# Patient Record
Sex: Female | Born: 1945
Health system: Southern US, Community
[De-identification: ages and names within clinical notes are randomized; demographics above are authoritative.]

## PROBLEM LIST (undated history)

## (undated) DIAGNOSIS — E039 Hypothyroidism, unspecified: Secondary | ICD-10-CM

## (undated) DIAGNOSIS — N390 Urinary tract infection, site not specified: Secondary | ICD-10-CM

## (undated) DIAGNOSIS — K219 Gastro-esophageal reflux disease without esophagitis: Secondary | ICD-10-CM

## (undated) DIAGNOSIS — Z973 Presence of spectacles and contact lenses: Secondary | ICD-10-CM

## (undated) DIAGNOSIS — E7800 Pure hypercholesterolemia, unspecified: Secondary | ICD-10-CM

## (undated) DIAGNOSIS — Z972 Presence of dental prosthetic device (complete) (partial): Secondary | ICD-10-CM

## (undated) HISTORY — PX: TUBAL LIGATION: SHX77

## (undated) HISTORY — DX: Pure hypercholesterolemia, unspecified: E78.00

## (undated) HISTORY — PX: OTHER SURGICAL HISTORY: SHX169

## (undated) HISTORY — PX: COLONOSCOPY: SHX174

## (undated) HISTORY — PX: PILONIDAL CYST EXCISION: SHX744

---

## 2000-11-24 ENCOUNTER — Other Ambulatory Visit: Admission: RE | Admit: 2000-11-24 | Discharge: 2000-11-24 | Payer: Self-pay | Admitting: *Deleted

## 2002-02-10 ENCOUNTER — Other Ambulatory Visit: Admission: RE | Admit: 2002-02-10 | Discharge: 2002-02-10 | Payer: Self-pay | Admitting: Obstetrics and Gynecology

## 2002-05-12 HISTORY — PX: LAPAROSCOPIC CHOLECYSTECTOMY: SUR755

## 2002-10-04 ENCOUNTER — Encounter: Payer: Self-pay | Admitting: Gastroenterology

## 2002-10-04 ENCOUNTER — Encounter: Admission: RE | Admit: 2002-10-04 | Discharge: 2002-10-04 | Payer: Self-pay | Admitting: Gastroenterology

## 2002-10-17 ENCOUNTER — Encounter: Payer: Self-pay | Admitting: Gastroenterology

## 2002-10-17 ENCOUNTER — Ambulatory Visit (HOSPITAL_COMMUNITY): Admission: RE | Admit: 2002-10-17 | Discharge: 2002-10-17 | Payer: Self-pay | Admitting: Gastroenterology

## 2002-10-19 ENCOUNTER — Encounter: Payer: Self-pay | Admitting: Gastroenterology

## 2002-10-19 ENCOUNTER — Ambulatory Visit (HOSPITAL_COMMUNITY): Admission: RE | Admit: 2002-10-19 | Discharge: 2002-10-19 | Payer: Self-pay | Admitting: Gastroenterology

## 2002-12-02 ENCOUNTER — Ambulatory Visit (HOSPITAL_COMMUNITY): Admission: RE | Admit: 2002-12-02 | Discharge: 2002-12-02 | Payer: Self-pay | Admitting: Gastroenterology

## 2003-01-10 ENCOUNTER — Encounter (INDEPENDENT_AMBULATORY_CARE_PROVIDER_SITE_OTHER): Payer: Self-pay | Admitting: Specialist

## 2003-01-10 ENCOUNTER — Encounter: Payer: Self-pay | Admitting: Surgery

## 2003-01-10 ENCOUNTER — Ambulatory Visit (HOSPITAL_COMMUNITY): Admission: RE | Admit: 2003-01-10 | Discharge: 2003-01-10 | Payer: Self-pay | Admitting: Surgery

## 2003-04-18 ENCOUNTER — Ambulatory Visit (HOSPITAL_COMMUNITY): Admission: RE | Admit: 2003-04-18 | Discharge: 2003-04-18 | Payer: Self-pay | Admitting: Family Medicine

## 2003-05-11 ENCOUNTER — Other Ambulatory Visit: Admission: RE | Admit: 2003-05-11 | Discharge: 2003-05-11 | Payer: Self-pay | Admitting: Obstetrics and Gynecology

## 2004-06-13 ENCOUNTER — Other Ambulatory Visit: Admission: RE | Admit: 2004-06-13 | Discharge: 2004-06-13 | Payer: Self-pay | Admitting: Obstetrics and Gynecology

## 2005-09-09 ENCOUNTER — Other Ambulatory Visit: Admission: RE | Admit: 2005-09-09 | Discharge: 2005-09-09 | Payer: Self-pay | Admitting: Obstetrics and Gynecology

## 2006-10-26 ENCOUNTER — Emergency Department (HOSPITAL_COMMUNITY): Admission: EM | Admit: 2006-10-26 | Discharge: 2006-10-26 | Payer: Self-pay | Admitting: Emergency Medicine

## 2010-07-11 ENCOUNTER — Other Ambulatory Visit (HOSPITAL_COMMUNITY)
Admission: RE | Admit: 2010-07-11 | Discharge: 2010-07-11 | Disposition: A | Discharge status: Home / Self Care | Payer: Medicare PPO | Source: Ambulatory Visit | Attending: Obstetrics and Gynecology | Admitting: Obstetrics and Gynecology

## 2010-07-11 ENCOUNTER — Other Ambulatory Visit: Payer: Self-pay | Admitting: Obstetrics and Gynecology

## 2010-07-11 DIAGNOSIS — Z01419 Encounter for gynecological examination (general) (routine) without abnormal findings: Secondary | ICD-10-CM | POA: Insufficient documentation

## 2010-09-27 NOTE — Op Note (Signed)
NAME:  Melanie Hernandez, Melanie Hernandez                       ACCOUNT NO.:  [id number]   MEDICAL RECORD NO.:  [id number]                   PATIENT TYPE:  AMB   LOCATION:  ENDO                                 FACILITY:  [facility]   PHYSICIAN:  Anselmo Rod, M.D.               DATE OF BIRTH:  11-02-1945   DATE OF PROCEDURE:  12/02/2002  DATE OF DISCHARGE:                                 OPERATIVE REPORT   PROCEDURE PERFORMED:  Screening colonoscopy.   ENDOSCOPIST:  Anselmo Rod, M.D.   INSTRUMENT USED:  Olympus video colonoscope.   INDICATIONS FOR PROCEDURE:  Screening colonoscopy being performed in a 65-  year-old white female.  Rule out colonic polyps, masses, etc.   PREPROCEDURE PREPARATION:  Informed consent was procured from the patient.  The patient was fasted for eight hours prior to the procedure and prepped  with a bottle of magnesium citrate and a gallon of GoLYTELY the night prior  to the procedure.   PREPROCEDURE PHYSICAL:  VITAL SIGNS:  The patient had stable vital signs.  NECK:  Supple.  CHEST:  Clear to auscultation.  S1 and S2 regular.  ABDOMEN:  Soft with normal bowel sounds.   DESCRIPTION OF PROCEDURE:  The patient was placed in the left lateral  decubitus position and sedated with 50 mg of Demerol and 5 mg of Versed  intravenously.  Once the patient was adequately sedated and maintained on  low flow oxygen and continuous cardiac monitoring, the Olympus video  colonoscope was advanced from the rectum to the cecum.  There was some  residual stool in the colon. Multiple washings were done.  No masses,  polyps, erosions, ulcerations or diverticula were seen.  The appendicular  orifice and the ileocecal valve were clearly visualized and photographed.  The terminal ileum appeared normal and without lesions.  Retroflexion of the  rectum revealed small nonbleeding internal hemorrhoids.  The patient  tolerated the procedure well without complications.   IMPRESSION:  Small  nonbleeding internal hemorrhoid, otherwise normal  colonoscopy to the terminal ileum.   RECOMMENDATIONS:  1. A high fiber diet with 20 to 25 grams regular diet and liberal fluid     intake has been advocated.  2.     Repeat colorectal cancer screening is recommended in next ten years unless     the patient develops any abnormal symptoms in the interim.  3. Outpatient follow up as-needed in the future.                                               Anselmo Rod, M.D.    JNM/MEDQ  D:  12/02/2002  T:  12/02/2002  Job:  [id number]   cc:   Laqueta Linden, M.D.  [address]  [place]  [place] [address]  Fax: [phone]   Molly Maduro A. Nicholos Johns, M.D.  [address]  [place]  [place] [address]  Fax: [phone]

## 2010-09-27 NOTE — Op Note (Signed)
NAME:  Melanie, [name]                       ACCOUNT NO.:  [id number]   MEDICAL RECORD NO.:  [id number]                   PATIENT TYPE:  OIB   LOCATION:  2899                                 FACILITY:  [facility]   PHYSICIAN:  Abigail Miyamoto, M.D.              DATE OF BIRTH:  [birth year]   DATE OF PROCEDURE:  01/10/2003  DATE OF DISCHARGE:                                 OPERATIVE REPORT   PREOPERATIVE DIAGNOSIS:  Biliary dyskinesia.   POSTOPERATIVE DIAGNOSIS:  Biliary dyskinesia.   OPERATION PERFORMED:  Laparoscopic cholecystectomy with intraoperative  cholangiogram.   SURGEON:  Douglas A. Magnus Ivan, M.D.   ASSISTANT:  Magnus Ivan, RN   ANESTHESIA:  General endotracheal.   ESTIMATED BLOOD LOSS:  Minimal.   OPERATIVE FINDINGS:  The patient was found to have a normal cholangiogram.   DESCRIPTION OF PROCEDURE:  The patient was brought to the operating room and  identified as Melanie Hernandez.  She was placed supine on the operating table  and general anesthesia was induced.  Her abdomen was prepped and draped in  the usual sterile fashion.  Using a #15 blade, a small transverse incision  was made just below the umbilicus. The incision was carried down to a large  umbilical hernia sac which was then completely excised with electrocautery.  The small fascial defect was then grasped with Kocher clamps and opened  further with a scalpel.  Hemostat was used to pass into the peritoneal  cavity.  Next, a 0 Vicryl pursestring suture was placed around the fascial  opening.  The Hasson port was placed through the opening and insufflation of  the abdomen was begun.  Next, a 12 mm port was placed in the patient's  epigastrium and two 5 mm ports were placed in the patient's right flank  under direct vision.  The gallbladder was then grasped and retracted above  the liver bed.  Several adhesions were taken down to the base of the  gallbladder.  The cystic duct was then easily dissected out  along with the  cystic artery.  The cystic duct was clipped once distally.  The cystic  artery was clipped twice proximally and once distally and transected with  scissors. A small opening was then made in the cystic duct with the  laparoscopic scissors. An angiocatheter was then inserted in the right upper  quadrant under direct vision. The cholangiocatheter was then passed through  the angiocatheter and placed into the opening in the cystic duct.  A  cholangiogram was then performed with contrast under direct fluoroscopy.  Good flow of contrast was seen into the entire biliary system and duodenum  without evidence of abnormality.  The cholangiocatheter was then removed.  The cystic duct was then clipped several times proximally and transected  with scissors.  The gallbladder was then slowly dissected free from the  liver bed with electrocautery.  Once the gallbladder was free from the liver  bed, it was placed in an Endosac and removed through the incision at the  umbilicus.  The liver bed was again examined.  Hemostasis was achieved with  cautery.  The 0 Vicryl at the umbilicus was tied in place closing the  fascial defect.  The abdomen was then irrigated with normal saline.  Again,  hemostasis appeared to be achieved.  All ports were then removed under  direct vision and the abdomen was deflated.  All incisions were then further  anesthetized with 0.25% Marcaine and then closed with 4-0 Monocryl  subcuticular sutures.  Steri-Strips, gauze and tape were then applied.  The  patient tolerated the procedure well.  All sponge, needle and instrument  counts were correct at the end of the procedure.  The patient was then  extubated in the operating room and taken in stable condition to the  recovery room.                                                   Abigail Miyamoto, M.D.    DB/MEDQ  D:  01/10/2003  T:  01/10/2003  Job:  [id number]

## 2012-03-22 ENCOUNTER — Encounter (HOSPITAL_BASED_OUTPATIENT_CLINIC_OR_DEPARTMENT_OTHER): Payer: Self-pay | Admitting: *Deleted

## 2012-03-22 ENCOUNTER — Other Ambulatory Visit: Payer: Self-pay | Admitting: Orthopaedic Surgery

## 2012-03-22 NOTE — H&P (Signed)
Melanie Hernandez is an 66 y.o. female.   Chief Complaint: right ankle pain HPI: Melanie Hernandez is 66 years old and passed out in her bathroom Sunday evening from symptoms of a UTI.  She had a dizzy spell and fell to the ground.  Shee immediately noticed right ankle pain and swelling.  She was seen at the [facility] walk-in clinic and diagnosed with a bimalleolar ankle fracture.she was put into a fiberglass posterior splint for comfort and given some pain medication.  Her x-rays show a displaced bimalleolar ankle fracture right side.  She is currently nonweightbearing and using crutches.  We have discussed with her proceeding on fixing this ankle fracture 2 improve her pain and function.  Past Medical History  Diagnosis Date  . UTI (lower urinary tract infection)     being tx now  . Hypothyroidism   . GERD (gastroesophageal reflux disease)   . Wears partial dentures     top-bottom-  . Wears glasses     Past Surgical History  Procedure Date  . Laparoscopic cholecystectomy 2004  . Colonoscopy   . Tubal ligation     No family history on file. Social History:  reports that she quit smoking about 31 years ago. She does not have any smokeless tobacco history on file. She reports that she drinks alcohol. She reports that she does not use illicit drugs.  Allergies: No Known Allergies  No prescriptions prior to admission    No results found for this or any previous visit (from the past 48 hour(s)). No results found.  Review of Systems  Constitutional: Negative.   HENT: Negative.   Eyes: Negative.   Respiratory: Negative.   Cardiovascular: Negative.   Gastrointestinal: Negative.   Genitourinary: Negative.   Musculoskeletal: Positive for joint pain.  Skin: Negative.   Neurological: Negative.   Endo/Heme/Allergies: Negative.   Psychiatric/Behavioral: Negative.     Height 5\' 4"  (1.626 m), weight 86.183 kg (190 lb). Physical Exam  Constitutional: She is oriented to person, place, and time. She  appears well-nourished.  HENT:  Head: Normocephalic.  Eyes: Pupils are equal, round, and reactive to light.  Neck: Normal range of motion.  Cardiovascular: Regular rhythm.   Respiratory: Breath sounds normal.  GI: Bowel sounds are normal.  Musculoskeletal:       Right ankle exam: She is in a well fitting posterior fiberglass splint.  Her toes have good motion and intact sensation.  Knee is unremarkable no effusion noted.  There is discomfort over the medial and lateral malleolus to palpation.  Neurological: She is alert and oriented to person, place, and time.  Skin: Skin is warm.  Psychiatric: She has a normal mood and affect.     Assessment/Plan Assessment: Right ankle displaced bimalleolar fracture Plan: Melanie Hernandez's ankle needs to be treated surgically.  We have reviewed the risks of anesthesia, infection and neurovascular injury associated with ORIF of her right ankle.  This can be done on an outpatient basis and she would be nonweightbearing for 6 weeks.  The surgery is planned for tomorrow.  Melanie Hernandez R 03/22/2012, 5:42 PM

## 2012-03-22 NOTE — Progress Notes (Signed)
Pt came in w/c-with husband-no labs needed-no cardiac or resp problems

## 2012-03-23 ENCOUNTER — Encounter (HOSPITAL_BASED_OUTPATIENT_CLINIC_OR_DEPARTMENT_OTHER)
Admission: RE | Disposition: A | Discharge status: Home / Self Care | Payer: Self-pay | Source: Ambulatory Visit | Attending: Orthopaedic Surgery

## 2012-03-23 ENCOUNTER — Encounter (HOSPITAL_BASED_OUTPATIENT_CLINIC_OR_DEPARTMENT_OTHER): Payer: Self-pay | Admitting: Anesthesiology

## 2012-03-23 ENCOUNTER — Ambulatory Visit (HOSPITAL_BASED_OUTPATIENT_CLINIC_OR_DEPARTMENT_OTHER)
Admission: RE | Admit: 2012-03-23 | Discharge: 2012-03-23 | Disposition: A | Discharge status: Home / Self Care | Payer: Medicare PPO | Source: Ambulatory Visit | Attending: Orthopaedic Surgery | Admitting: Orthopaedic Surgery

## 2012-03-23 DIAGNOSIS — S82843A Displaced bimalleolar fracture of unspecified lower leg, initial encounter for closed fracture: Secondary | ICD-10-CM | POA: Insufficient documentation

## 2012-03-23 DIAGNOSIS — E039 Hypothyroidism, unspecified: Secondary | ICD-10-CM | POA: Insufficient documentation

## 2012-03-23 DIAGNOSIS — W19XXXA Unspecified fall, initial encounter: Secondary | ICD-10-CM | POA: Insufficient documentation

## 2012-03-23 DIAGNOSIS — Y92009 Unspecified place in unspecified non-institutional (private) residence as the place of occurrence of the external cause: Secondary | ICD-10-CM | POA: Insufficient documentation

## 2012-03-23 DIAGNOSIS — K219 Gastro-esophageal reflux disease without esophagitis: Secondary | ICD-10-CM | POA: Insufficient documentation

## 2012-03-23 HISTORY — DX: Gastro-esophageal reflux disease without esophagitis: K21.9

## 2012-03-23 HISTORY — DX: Presence of spectacles and contact lenses: Z97.3

## 2012-03-23 HISTORY — DX: Urinary tract infection, site not specified: N39.0

## 2012-03-23 HISTORY — DX: Presence of dental prosthetic device (complete) (partial): Z97.2

## 2012-03-23 HISTORY — DX: Hypothyroidism, unspecified: E03.9

## 2012-03-23 HISTORY — PX: ORIF ANKLE FRACTURE: SHX5408

## 2012-03-23 SURGERY — OPEN REDUCTION INTERNAL FIXATION (ORIF) ANKLE FRACTURE
Anesthesia: General | Site: Ankle | Laterality: Right | Wound class: Clean

## 2012-03-23 SURGICAL SUPPLY — 80 items
APL SKNCLS STERI-STRIP NONHPOA (GAUZE/BANDAGES/DRESSINGS)
BANDAGE ELASTIC 6 VELCRO ST LF (GAUZE/BANDAGES/DRESSINGS) ×2 IMPLANT
BANDAGE ESMARK 6X9 LF (GAUZE/BANDAGES/DRESSINGS) ×1 IMPLANT
BANDAGE GAUZE 4  KLING STR (GAUZE/BANDAGES/DRESSINGS) IMPLANT
BANDAGE GAUZE ELAST BULKY 4 IN (GAUZE/BANDAGES/DRESSINGS) ×2 IMPLANT
BENZOIN TINCTURE PRP APPL 2/3 (GAUZE/BANDAGES/DRESSINGS) IMPLANT
BIT DRILL QC 3.5X110 (BIT) ×1 IMPLANT
BLADE SURG 15 STRL LF DISP TIS (BLADE) ×2 IMPLANT
BLADE SURG 15 STRL SS (BLADE) ×4
BNDG CMPR 9X6 STRL LF SNTH (GAUZE/BANDAGES/DRESSINGS) ×1
BNDG ESMARK 6X9 LF (GAUZE/BANDAGES/DRESSINGS) ×2
CANISTER SUCTION 1200CC (MISCELLANEOUS) ×1 IMPLANT
COVER TABLE BACK 60X90 (DRAPES) ×2 IMPLANT
CUFF TOURNIQUET SINGLE 24IN (TOURNIQUET CUFF) ×1 IMPLANT
CUFF TOURNIQUET SINGLE 34IN LL (TOURNIQUET CUFF) IMPLANT
DRAPE EXTREMITY T 121X128X90 (DRAPE) ×2 IMPLANT
DRAPE OEC MINIVIEW 54X84 (DRAPES) ×2 IMPLANT
DRAPE U-SHAPE 47X51 STRL (DRAPES) ×1 IMPLANT
DRSG EMULSION OIL 3X3 NADH (GAUZE/BANDAGES/DRESSINGS) ×2 IMPLANT
DURAPREP 26ML APPLICATOR (WOUND CARE) ×2 IMPLANT
ELECT REM PT RETURN 9FT ADLT (ELECTROSURGICAL) ×2
ELECTRODE REM PT RTRN 9FT ADLT (ELECTROSURGICAL) ×1 IMPLANT
GAUZE SPONGE 4X4 16PLY XRAY LF (GAUZE/BANDAGES/DRESSINGS) IMPLANT
GLOVE BIO SURGEON STRL SZ8.5 (GLOVE) ×2 IMPLANT
GLOVE BIOGEL PI IND STRL 8 (GLOVE) ×1 IMPLANT
GLOVE BIOGEL PI IND STRL 8.5 (GLOVE) ×1 IMPLANT
GLOVE BIOGEL PI INDICATOR 8 (GLOVE) ×1
GLOVE BIOGEL PI INDICATOR 8.5 (GLOVE) ×1
GLOVE SS BIOGEL STRL SZ 8 (GLOVE) ×1 IMPLANT
GLOVE SUPERSENSE BIOGEL SZ 8 (GLOVE) ×1
GOWN PREVENTION PLUS XLARGE (GOWN DISPOSABLE) ×2 IMPLANT
GOWN PREVENTION PLUS XXLARGE (GOWN DISPOSABLE) ×3 IMPLANT
KIT 1/3 TUB PL 6H 73M (Orthopedic Implant) IMPLANT
NEEDLE HYPO 22GX1.5 SAFETY (NEEDLE) ×2 IMPLANT
NS IRRIG 1000ML POUR BTL (IV SOLUTION) ×2 IMPLANT
PACK BASIN DAY SURGERY FS (CUSTOM PROCEDURE TRAY) ×2 IMPLANT
PAD CAST 4YDX4 CTTN HI CHSV (CAST SUPPLIES) ×2 IMPLANT
PADDING CAST ABS 4INX4YD NS (CAST SUPPLIES) ×1
PADDING CAST ABS COTTON 4X4 ST (CAST SUPPLIES) ×1 IMPLANT
PADDING CAST COTTON 4X4 STRL (CAST SUPPLIES) ×2
PENCIL BUTTON HOLSTER BLD 10FT (ELECTRODE) ×2 IMPLANT
PROS 1/3 TUB PL 6H 73M (Orthopedic Implant) ×2 IMPLANT
SCREW CANC PT 4.0X30 (Screw) ×1 IMPLANT
SCREW CANC PT 4.0X40 (Screw) ×2 IMPLANT
SCREW CANC PT 40X14X4X6X (Screw) IMPLANT
SCREW CORTEX 3.5 12MM (Screw) ×2 IMPLANT
SCREW CORTEX 3.5 14MM (Screw) ×1 IMPLANT
SCREW CORTEX 3.5 16MM (Screw) ×1 IMPLANT
SCREW CORTEX 3.5 18MM (Screw) ×2 IMPLANT
SCREW CORTEX 3.5 22MM (Screw) ×1 IMPLANT
SCREW LOCK CORT ST 3.5X12 (Screw) IMPLANT
SCREW LOCK CORT ST 3.5X14 (Screw) IMPLANT
SCREW LOCK CORT ST 3.5X16 (Screw) IMPLANT
SCREW LOCK CORT ST 3.5X18 (Screw) IMPLANT
SCREW LOCK CORT ST 3.5X22 (Screw) IMPLANT
SHEET MEDIUM DRAPE 40X70 STRL (DRAPES) ×4 IMPLANT
SLEEVE SCD COMPRESS KNEE MED (MISCELLANEOUS) ×1 IMPLANT
SPLINT FAST PLASTER 5X30 (CAST SUPPLIES) ×10
SPLINT PLASTER CAST FAST 5X30 (CAST SUPPLIES) IMPLANT
SPONGE GAUZE 4X4 12PLY (GAUZE/BANDAGES/DRESSINGS) ×2 IMPLANT
SPONGE LAP 4X18 X RAY DECT (DISPOSABLE) ×2 IMPLANT
STAPLER VISISTAT 35W (STAPLE) ×1 IMPLANT
STOCKINETTE 6  STRL (DRAPES) ×1
STOCKINETTE 6 STRL (DRAPES) ×1 IMPLANT
STRIP CLOSURE SKIN 1/2X4 (GAUZE/BANDAGES/DRESSINGS) IMPLANT
SUCTION FRAZIER TIP 10 FR DISP (SUCTIONS) ×1 IMPLANT
SUT BONE WAX W31G (SUTURE) IMPLANT
SUT ETHILON 3 0 PS 1 (SUTURE) IMPLANT
SUT ETHILON 4 0 PS 2 18 (SUTURE) IMPLANT
SUT VIC AB 0 CT1 27 (SUTURE) ×2
SUT VIC AB 0 CT1 27XBRD ANBCTR (SUTURE) IMPLANT
SUT VIC AB 2-0 SH 27 (SUTURE) ×2
SUT VIC AB 2-0 SH 27XBRD (SUTURE) IMPLANT
SYR BULB 3OZ (MISCELLANEOUS) ×2 IMPLANT
SYR CONTROL 10ML LL (SYRINGE) ×2 IMPLANT
TOWEL OR NON WOVEN STRL DISP B (DISPOSABLE) ×2 IMPLANT
TUBE CONNECTING 20X1/4 (TUBING) ×1 IMPLANT
UNDERPAD 30X30 INCONTINENT (UNDERPADS AND DIAPERS) ×2 IMPLANT
WATER STERILE IRR 1000ML POUR (IV SOLUTION) ×1 IMPLANT
YANKAUER SUCT BULB TIP NO VENT (SUCTIONS) IMPLANT

## 2012-03-24 MED ORDER — 0.9 % SODIUM CHLORIDE (POUR BTL) OPTIME
TOPICAL | Status: DC | PRN
  Administered 2012-03-24: 200 mL

## 2012-03-24 MED ORDER — BUPIVACAINE-EPINEPHRINE PF 0.5-1:200000 % IJ SOLN
INTRAMUSCULAR | Status: DC | PRN
  Administered 2012-03-23: 10 mL

## 2012-03-24 NOTE — Op Note (Signed)
NAME:  Melanie Hernandez, Melanie Hernandez              ACCOUNT NO.:  [id number]  MEDICAL RECORD NO.:  LOCATION:                                 FACILITY:  PHYSICIAN:  Lubertha Basque. Jerl Santos, M.D.     DATE OF BIRTH:  DATE OF PROCEDURE:  03/23/2012 DATE OF DISCHARGE:                              OPERATIVE REPORT   PREOPERATIVE DIAGNOSIS:  Right ankle bimalleolar fracture.  POSTOPERATIVE DIAGNOSES:  Right ankle bimalleolar fracture.  PROCEDURE:  Right ankle open reduction internal fraction.  ANESTHESIA:  General and block.  ATTENDING SURGEON:  Lubertha Basque. Jerl Santos, MD  ASSISTING:  Lindwood Qua, PA  INDICATION FOR PROCEDURE:  The patient is a 66 year old woman who fell in her bathroom a few days ago and suffered a significantly displaced ankle fracture.  She was splinted and scheduled for today's procedure in hopes of realigning the joint.  Informed operative consent was obtained after discussion of possible complications including reaction to anesthesia, infection, wound healing problems, and arthritic change.  SUMMARY OF FINDINGS AND PROCEDURE:  Under general anesthesia and a block through 2 separate incisions, ORIF of the right ankle was performed here in the lateral aspect to stabilize with an interfragmentary screw, a 6 hole side plate by [employer].  In the medial aspect we used 2 malleolar screws.  We used fluoroscopy throughout the case to make appropriate intraoperative decisions and I read all of these views myself.  Lindwood Qua assisted throughout and was invaluable to the completion of the case in that he helped position and retract while I performed the procedure.  He also closed simultaneously to help minimize OR time.  DESCRIPTION OF PROCEDURE:  The patient was taken to the operating suite where general anesthetic was applied without difficulty.  She was also given a block in the pre-anesthesia area.  She was positioned supine with a bump under the right hip and prepped and  draped in normal sterile fashion.  After the administration of IV Kefzol and an appropriate time- out, the right leg was elevated, exsanguinated, and a tourniquet inflated out the calf.  We made a lateral incision, centered at the fracture site with dissection down to the lateral malleolus.  This was a fairly comminuted fracture with a large fragment displaced laterally which was half the distal fragment.  We ended up securing this to the rest of the distal fragment with an interfragmentary screw from the back.  This was a fully threaded cancellous screw.  So we then placed a 1/3 tubular 6 hole 5 plate appropriately contoured to the lateral malleolus and used 6 fully threaded cortical screws.  We achieved enough purchase with all of these.  Fluoroscopy views confirmed adequate reduction here and re-creation of the proper length of the fibula.  We then opened medially through a separate incision.  The saphenous vein was taken out of harm's way with dissection down to the fracture site. Then debris was cleaned, followed by reduction with a clamp.  Then placed 2 partially threaded cancellous screws which was secured the medial fragment to the tibia.  Again fluoroscopy showed good reduction of the fracture and placement of hardware.  The wounds were irrigated, followed by closure of  deep tissues with 0 and 2-0 undyed Vicryl.  The tourniquet was deflated and a small amount of bleeding was easily controlled with some pressure.  We then used staples to close the skin. I injected some Marcaine about the incisions followed by Adaptic, dry gauze, and a posterior splint of plaster with the ankle in neutral position.  Estimated blood loss and fluids as well as accurate tourniquet time can be obtained from anesthesia records.  DISPOSITION:  The patient was extubated in operating room and taken to recovery room in stable condition.  She was to go home same-day and follow up in the office closely.  I  will contact her by phone tonight.     Lubertha Basque Jerl Santos, M.D.     PGD/MEDQ  D:  03/23/2012  T:  03/23/2012  Job:  [id number]

## 2012-03-24 NOTE — OR Nursing (Signed)
Internet Service not available day of surgery for electronic documentation in Epic.  Late entry transcribed from paper intraoperative documentation completed by Vickii Chafe, RN

## 2012-03-25 ENCOUNTER — Encounter (HOSPITAL_BASED_OUTPATIENT_CLINIC_OR_DEPARTMENT_OTHER): Payer: Self-pay | Admitting: Orthopaedic Surgery

## 2012-07-15 ENCOUNTER — Other Ambulatory Visit (HOSPITAL_COMMUNITY)
Admission: RE | Admit: 2012-07-15 | Discharge: 2012-07-15 | Disposition: A | Discharge status: Home / Self Care | Payer: Medicare PPO | Source: Ambulatory Visit | Attending: Obstetrics and Gynecology | Admitting: Obstetrics and Gynecology

## 2012-07-15 ENCOUNTER — Other Ambulatory Visit: Payer: Self-pay | Admitting: Obstetrics and Gynecology

## 2012-07-15 DIAGNOSIS — Z1151 Encounter for screening for human papillomavirus (HPV): Secondary | ICD-10-CM | POA: Insufficient documentation

## 2012-07-15 DIAGNOSIS — Z124 Encounter for screening for malignant neoplasm of cervix: Secondary | ICD-10-CM | POA: Insufficient documentation

## 2014-07-17 ENCOUNTER — Other Ambulatory Visit: Payer: Self-pay | Admitting: Obstetrics and Gynecology

## 2014-07-17 ENCOUNTER — Other Ambulatory Visit (HOSPITAL_COMMUNITY)
Admission: RE | Admit: 2014-07-17 | Discharge: 2014-07-17 | Disposition: A | Discharge status: Home / Self Care | Payer: Medicare PPO | Source: Ambulatory Visit | Attending: Obstetrics and Gynecology | Admitting: Obstetrics and Gynecology

## 2014-07-17 DIAGNOSIS — Z01419 Encounter for gynecological examination (general) (routine) without abnormal findings: Secondary | ICD-10-CM | POA: Insufficient documentation

## 2014-07-19 LAB — CYTOLOGY - PAP

## 2016-07-07 DIAGNOSIS — H6123 Impacted cerumen, bilateral: Secondary | ICD-10-CM | POA: Diagnosis not present

## 2016-07-30 DIAGNOSIS — Z01419 Encounter for gynecological examination (general) (routine) without abnormal findings: Secondary | ICD-10-CM | POA: Diagnosis not present

## 2016-08-05 DIAGNOSIS — Z1231 Encounter for screening mammogram for malignant neoplasm of breast: Secondary | ICD-10-CM | POA: Diagnosis not present

## 2016-09-17 DIAGNOSIS — K219 Gastro-esophageal reflux disease without esophagitis: Secondary | ICD-10-CM | POA: Diagnosis not present

## 2016-09-17 DIAGNOSIS — E039 Hypothyroidism, unspecified: Secondary | ICD-10-CM | POA: Diagnosis not present

## 2016-09-17 DIAGNOSIS — Z0000 Encounter for general adult medical examination without abnormal findings: Secondary | ICD-10-CM | POA: Diagnosis not present

## 2016-09-17 DIAGNOSIS — M8580 Other specified disorders of bone density and structure, unspecified site: Secondary | ICD-10-CM | POA: Diagnosis not present

## 2016-09-17 DIAGNOSIS — L309 Dermatitis, unspecified: Secondary | ICD-10-CM | POA: Diagnosis not present

## 2016-09-17 DIAGNOSIS — Z6833 Body mass index (BMI) 33.0-33.9, adult: Secondary | ICD-10-CM | POA: Diagnosis not present

## 2016-09-17 DIAGNOSIS — Z1389 Encounter for screening for other disorder: Secondary | ICD-10-CM | POA: Diagnosis not present

## 2016-09-17 DIAGNOSIS — E7800 Pure hypercholesterolemia, unspecified: Secondary | ICD-10-CM | POA: Diagnosis not present

## 2017-02-19 DIAGNOSIS — Z23 Encounter for immunization: Secondary | ICD-10-CM | POA: Diagnosis not present

## 2017-06-16 DIAGNOSIS — H52223 Regular astigmatism, bilateral: Secondary | ICD-10-CM | POA: Diagnosis not present

## 2017-06-16 DIAGNOSIS — H5213 Myopia, bilateral: Secondary | ICD-10-CM | POA: Diagnosis not present

## 2017-06-16 DIAGNOSIS — H524 Presbyopia: Secondary | ICD-10-CM | POA: Diagnosis not present

## 2017-08-07 DIAGNOSIS — Z1231 Encounter for screening mammogram for malignant neoplasm of breast: Secondary | ICD-10-CM | POA: Diagnosis not present

## 2017-09-23 DIAGNOSIS — Z6834 Body mass index (BMI) 34.0-34.9, adult: Secondary | ICD-10-CM | POA: Diagnosis not present

## 2017-09-23 DIAGNOSIS — E7800 Pure hypercholesterolemia, unspecified: Secondary | ICD-10-CM | POA: Diagnosis not present

## 2017-09-23 DIAGNOSIS — R030 Elevated blood-pressure reading, without diagnosis of hypertension: Secondary | ICD-10-CM | POA: Diagnosis not present

## 2017-09-23 DIAGNOSIS — Z0000 Encounter for general adult medical examination without abnormal findings: Secondary | ICD-10-CM | POA: Diagnosis not present

## 2017-09-23 DIAGNOSIS — Z1389 Encounter for screening for other disorder: Secondary | ICD-10-CM | POA: Diagnosis not present

## 2017-09-23 DIAGNOSIS — L309 Dermatitis, unspecified: Secondary | ICD-10-CM | POA: Diagnosis not present

## 2017-09-23 DIAGNOSIS — E039 Hypothyroidism, unspecified: Secondary | ICD-10-CM | POA: Diagnosis not present

## 2017-09-23 DIAGNOSIS — K219 Gastro-esophageal reflux disease without esophagitis: Secondary | ICD-10-CM | POA: Diagnosis not present

## 2017-09-23 DIAGNOSIS — M8580 Other specified disorders of bone density and structure, unspecified site: Secondary | ICD-10-CM | POA: Diagnosis not present

## 2017-10-06 DIAGNOSIS — Z780 Asymptomatic menopausal state: Secondary | ICD-10-CM | POA: Diagnosis not present

## 2018-02-16 DIAGNOSIS — L309 Dermatitis, unspecified: Secondary | ICD-10-CM | POA: Diagnosis not present

## 2018-02-16 DIAGNOSIS — M25571 Pain in right ankle and joints of right foot: Secondary | ICD-10-CM | POA: Diagnosis not present

## 2018-02-19 DIAGNOSIS — Z23 Encounter for immunization: Secondary | ICD-10-CM | POA: Diagnosis not present

## 2018-02-22 DIAGNOSIS — S92344A Nondisplaced fracture of fourth metatarsal bone, right foot, initial encounter for closed fracture: Secondary | ICD-10-CM | POA: Diagnosis not present

## 2018-03-08 DIAGNOSIS — S92344A Nondisplaced fracture of fourth metatarsal bone, right foot, initial encounter for closed fracture: Secondary | ICD-10-CM | POA: Diagnosis not present

## 2018-04-07 DIAGNOSIS — S92344A Nondisplaced fracture of fourth metatarsal bone, right foot, initial encounter for closed fracture: Secondary | ICD-10-CM | POA: Diagnosis not present

## 2018-04-27 DIAGNOSIS — L400 Psoriasis vulgaris: Secondary | ICD-10-CM | POA: Diagnosis not present

## 2018-04-28 DIAGNOSIS — S92344A Nondisplaced fracture of fourth metatarsal bone, right foot, initial encounter for closed fracture: Secondary | ICD-10-CM | POA: Diagnosis not present

## 2018-10-01 DIAGNOSIS — E039 Hypothyroidism, unspecified: Secondary | ICD-10-CM | POA: Diagnosis not present

## 2018-10-01 DIAGNOSIS — E7800 Pure hypercholesterolemia, unspecified: Secondary | ICD-10-CM | POA: Diagnosis not present

## 2018-10-06 DIAGNOSIS — L309 Dermatitis, unspecified: Secondary | ICD-10-CM | POA: Diagnosis not present

## 2018-10-06 DIAGNOSIS — K219 Gastro-esophageal reflux disease without esophagitis: Secondary | ICD-10-CM | POA: Diagnosis not present

## 2018-10-06 DIAGNOSIS — E039 Hypothyroidism, unspecified: Secondary | ICD-10-CM | POA: Diagnosis not present

## 2018-10-06 DIAGNOSIS — Z0000 Encounter for general adult medical examination without abnormal findings: Secondary | ICD-10-CM | POA: Diagnosis not present

## 2018-10-06 DIAGNOSIS — M8580 Other specified disorders of bone density and structure, unspecified site: Secondary | ICD-10-CM | POA: Diagnosis not present

## 2018-10-06 DIAGNOSIS — R030 Elevated blood-pressure reading, without diagnosis of hypertension: Secondary | ICD-10-CM | POA: Diagnosis not present

## 2018-10-06 DIAGNOSIS — E7800 Pure hypercholesterolemia, unspecified: Secondary | ICD-10-CM | POA: Diagnosis not present

## 2018-10-06 DIAGNOSIS — Z1389 Encounter for screening for other disorder: Secondary | ICD-10-CM | POA: Diagnosis not present

## 2019-01-20 DIAGNOSIS — Z23 Encounter for immunization: Secondary | ICD-10-CM | POA: Diagnosis not present

## 2019-05-19 DIAGNOSIS — R194 Change in bowel habit: Secondary | ICD-10-CM | POA: Diagnosis not present

## 2019-05-19 DIAGNOSIS — R140 Abdominal distension (gaseous): Secondary | ICD-10-CM | POA: Diagnosis not present

## 2019-05-19 DIAGNOSIS — R1033 Periumbilical pain: Secondary | ICD-10-CM | POA: Diagnosis not present

## 2019-05-19 DIAGNOSIS — K219 Gastro-esophageal reflux disease without esophagitis: Secondary | ICD-10-CM | POA: Diagnosis not present

## 2019-05-19 DIAGNOSIS — E669 Obesity, unspecified: Secondary | ICD-10-CM | POA: Diagnosis not present

## 2019-05-23 ENCOUNTER — Other Ambulatory Visit: Payer: Self-pay | Admitting: Gastroenterology

## 2019-05-23 DIAGNOSIS — K9289 Other specified diseases of the digestive system: Secondary | ICD-10-CM

## 2019-05-27 DIAGNOSIS — R300 Dysuria: Secondary | ICD-10-CM | POA: Diagnosis not present

## 2019-05-27 DIAGNOSIS — N3000 Acute cystitis without hematuria: Secondary | ICD-10-CM | POA: Diagnosis not present

## 2019-06-01 ENCOUNTER — Ambulatory Visit
Admission: RE | Admit: 2019-06-01 | Discharge: 2019-06-01 | Disposition: A | Discharge status: Home / Self Care | Payer: Medicare Other | Source: Ambulatory Visit | Attending: Gastroenterology | Admitting: Gastroenterology

## 2019-06-01 DIAGNOSIS — K9289 Other specified diseases of the digestive system: Secondary | ICD-10-CM | POA: Diagnosis not present

## 2019-06-01 DIAGNOSIS — R140 Abdominal distension (gaseous): Secondary | ICD-10-CM | POA: Diagnosis not present

## 2019-06-07 DIAGNOSIS — R102 Pelvic and perineal pain: Secondary | ICD-10-CM | POA: Diagnosis not present

## 2019-06-07 DIAGNOSIS — N859 Noninflammatory disorder of uterus, unspecified: Secondary | ICD-10-CM | POA: Diagnosis not present

## 2019-06-07 DIAGNOSIS — B372 Candidiasis of skin and nail: Secondary | ICD-10-CM | POA: Diagnosis not present

## 2019-06-09 DIAGNOSIS — R140 Abdominal distension (gaseous): Secondary | ICD-10-CM | POA: Diagnosis not present

## 2019-06-09 DIAGNOSIS — K219 Gastro-esophageal reflux disease without esophagitis: Secondary | ICD-10-CM | POA: Diagnosis not present

## 2019-06-09 DIAGNOSIS — E669 Obesity, unspecified: Secondary | ICD-10-CM | POA: Diagnosis not present

## 2019-08-18 DIAGNOSIS — M79671 Pain in right foot: Secondary | ICD-10-CM | POA: Diagnosis not present

## 2019-09-06 DIAGNOSIS — M79671 Pain in right foot: Secondary | ICD-10-CM | POA: Diagnosis not present

## 2019-09-07 DIAGNOSIS — I10 Essential (primary) hypertension: Secondary | ICD-10-CM | POA: Diagnosis not present

## 2019-09-07 DIAGNOSIS — R1013 Epigastric pain: Secondary | ICD-10-CM | POA: Diagnosis not present

## 2019-09-07 DIAGNOSIS — E039 Hypothyroidism, unspecified: Secondary | ICD-10-CM | POA: Diagnosis not present

## 2019-09-12 DIAGNOSIS — R739 Hyperglycemia, unspecified: Secondary | ICD-10-CM | POA: Diagnosis not present

## 2019-09-12 DIAGNOSIS — R350 Frequency of micturition: Secondary | ICD-10-CM | POA: Diagnosis not present

## 2019-09-12 DIAGNOSIS — I10 Essential (primary) hypertension: Secondary | ICD-10-CM | POA: Diagnosis not present

## 2019-10-11 DIAGNOSIS — M79675 Pain in left toe(s): Secondary | ICD-10-CM | POA: Diagnosis not present

## 2019-10-11 DIAGNOSIS — I10 Essential (primary) hypertension: Secondary | ICD-10-CM | POA: Diagnosis not present

## 2019-10-11 DIAGNOSIS — Z0000 Encounter for general adult medical examination without abnormal findings: Secondary | ICD-10-CM | POA: Diagnosis not present

## 2019-10-11 DIAGNOSIS — E7800 Pure hypercholesterolemia, unspecified: Secondary | ICD-10-CM | POA: Diagnosis not present

## 2019-10-11 DIAGNOSIS — M8580 Other specified disorders of bone density and structure, unspecified site: Secondary | ICD-10-CM | POA: Diagnosis not present

## 2019-10-11 DIAGNOSIS — K219 Gastro-esophageal reflux disease without esophagitis: Secondary | ICD-10-CM | POA: Diagnosis not present

## 2019-10-11 DIAGNOSIS — Z1389 Encounter for screening for other disorder: Secondary | ICD-10-CM | POA: Diagnosis not present

## 2019-10-11 DIAGNOSIS — E039 Hypothyroidism, unspecified: Secondary | ICD-10-CM | POA: Diagnosis not present

## 2019-10-11 DIAGNOSIS — L309 Dermatitis, unspecified: Secondary | ICD-10-CM | POA: Diagnosis not present

## 2019-10-12 DIAGNOSIS — M79671 Pain in right foot: Secondary | ICD-10-CM | POA: Diagnosis not present

## 2019-11-22 DIAGNOSIS — Z1231 Encounter for screening mammogram for malignant neoplasm of breast: Secondary | ICD-10-CM | POA: Diagnosis not present

## 2019-12-12 ENCOUNTER — Encounter: Payer: Self-pay | Admitting: Neurology

## 2019-12-12 ENCOUNTER — Ambulatory Visit: Payer: Medicare HMO | Admitting: Neurology

## 2019-12-12 VITALS — BP 135/85 | HR 79 | Ht 63.0 in | Wt 189.0 lb

## 2019-12-12 DIAGNOSIS — M79604 Pain in right leg: Secondary | ICD-10-CM

## 2019-12-12 MED ORDER — MELOXICAM 15 MG PO TABS: 15.0000 mg | ORAL_TABLET | Freq: Every day | ORAL | 1 refills | Status: AC

## 2019-12-12 NOTE — Addendum Note (Signed)
Addended by: Kathrynn Ducking on: 12/12/2019 06:34 PM   Modules accepted: Level of Service

## 2019-12-12 NOTE — Progress Notes (Signed)
Reason for visit: Right leg pain  Referring physician: Dr. Nyoka Cowden is a 74 y.o. female  History of present illness:  Ms. Stubbe is a 74 year old right-handed white female with a history of right ankle surgery 7 to 8 months ago.  The patient began having some discomfort in the big toe on the right foot in December 2020.  Over the last 2 months, she has had some discomfort that involves the right knee but also the posterior thigh on the right and into the right hip and right groin area.  The patient may occasionally have some back pain but this is not a significant feature of her complaints.  She has also developed some big toe discomfort on the left as well and some left knee pain.  The patient may have some occasional swelling in the right ankle.  She is no longer able to walk longer distances.  Prolonged sitting will also seem to bother her.  She has had some increased frequency and urgency with the bladder but denies any problems controlling the bowels.  She denies any significant balance issues.  She denies any neck pain or pain down the arms.  She is sent to this office for further evaluation.  She reports no overt numbness in the lower extremities.  Past Medical History:  Diagnosis Date  . GERD (gastroesophageal reflux disease)   . High cholesterol   . Hypothyroidism   . UTI (lower urinary tract infection)    being tx now  . Wears glasses   . Wears partial dentures    top-bottom-    Past Surgical History:  Procedure Laterality Date  . breast cysts  1970s   "it probably was both" had problems with milk ducts  . COLONOSCOPY    . LAPAROSCOPIC CHOLECYSTECTOMY  2004  . ORIF ANKLE FRACTURE  03/23/2012   Procedure: OPEN REDUCTION INTERNAL FIXATION (ORIF) ANKLE FRACTURE;  Surgeon: Hessie Dibble, MD;  Location: [place];  Service: Orthopedics;  Laterality: Right;  . PILONIDAL CYST EXCISION  1960s  . TUBAL LIGATION      Family History    Problem Relation Age of Onset  . Stroke Mother   . Other Father        "cardiac respiratory arrest"  . Heart disease Father        "clogged arteries"    Social history:  reports that she quit smoking about 38 years ago. She has never used smokeless tobacco. She reports current alcohol use. She reports that she does not use drugs.  Medications:  Prior to Admission medications   Medication Sig Start Date End Date Taking? Authorizing Provider  amLODipine (NORVASC) 10 MG tablet Take 10 mg by mouth daily.   Yes [provider]  aspirin 81 MG tablet Take 81 mg by mouth daily.   Yes [provider]  calcium carbonate (OS-CAL) 600 MG TABS Take 600 mg by mouth daily.    Yes [provider]  famotidine (PEPCID) 40 MG tablet Take 40 mg by mouth daily.   Yes [provider]  HYDROcodone-acetaminophen (VICODIN) 5-500 MG per tablet Take 1 tablet by mouth every 6 (six) hours as needed.   Yes [provider]  levothyroxine (SYNTHROID, LEVOTHROID) 75 MCG tablet Take 75 mcg by mouth daily.   Yes [provider]  omeprazole (PRILOSEC OTC) 20 MG tablet Take 20 mg by mouth daily.   Yes [provider]  Probiotic Product (PROBIOTIC PO) Take  by mouth daily.    Yes [provider]  simvastatin (ZOCOR) 20 MG tablet Take 20 mg by mouth daily.   Yes [provider]     No Known Allergies  ROS:  Out of a complete 14 system review of symptoms, the patient complains only of the following symptoms, and all other reviewed systems are negative.  Bilateral knee pain Right leg pain Ankle swelling  Blood pressure (!) 135/85, pulse 79, height 5\' 3"  (1.6 m), weight 189 lb (85.7 kg).  Physical Exam  General: The patient is alert and cooperative at the time of the examination.  The patient is moderately obese.  Eyes: Pupils are equal, round, and reactive to light. Discs are flat bilaterally.  Neck: The neck is supple, no carotid  bruits are noted.  Respiratory: The respiratory examination is clear.  Cardiovascular: The cardiovascular examination reveals a regular rate and rhythm, no obvious murmurs or rubs are noted.  Skin: Extremities are without significant edema.  Neurologic Exam  Mental status: The patient is alert and oriented x 3 at the time of the examination. The patient has apparent normal recent and remote memory, with an apparently normal attention span and concentration ability.  Cranial nerves: Facial symmetry is present. There is good sensation of the face to pinprick and soft touch bilaterally. The strength of the facial muscles and the muscles to head turning and shoulder shrug are normal bilaterally. Speech is well enunciated, no aphasia or dysarthria is noted. Extraocular movements are full. Visual fields are full. The tongue is midline, and the patient has symmetric elevation of the soft palate. No obvious hearing deficits are noted.  Motor: The motor testing reveals 5 over 5 strength of all 4 extremities. Good symmetric motor tone is noted throughout.  Sensory: Sensory testing is intact to pinprick, soft touch, vibration sensation, and position sense on all 4 extremities. No evidence of extinction is noted.  Coordination: Cerebellar testing reveals good finger-nose-finger and heel-to-shin bilaterally.  Gait and station: Gait is normal. Tandem gait is slightly unsteady. Romberg is negative. No drift is seen.  The patient is able to walk on heels and the toes bilaterally.  Reflexes: Deep tendon reflexes are symmetric and normal bilaterally, with exception of slight decrease in the right ankle jerk reflexes compared to the left. Toes are downgoing bilaterally.   Assessment/Plan:  1.  Bilateral foot pain, right leg pain  The patient does not appear to have a lot of sensory alteration in either leg.  The patient is having some discomfort at multiple levels on the right leg, certainly a lumbar  radiculopathy does need to be considered.  Some of the discomfort appears to be arthritic in nature affecting the knees and possibly the right hip.  The patient will be placed on Mobic taking 15 mg daily.  She will be set up for nerve conduction studies on both legs and EMG on the right leg.  We may consider MRI of the lumbar spine in the future.  She will otherwise follow-up in 4 months.  Jill Alexanders MD 12/12/2019 10:09 AM  [facility] [address] [place], [place] [address]  Phone [phone] Fax [phone]

## 2019-12-13 ENCOUNTER — Telehealth: Payer: Self-pay | Admitting: Neurology

## 2019-12-13 MED ORDER — PREDNISONE 5 MG PO TABS
ORAL_TABLET | ORAL | 0 refills | Status: AC
Start: 2019-12-13 — End: ?

## 2019-12-13 NOTE — Addendum Note (Signed)
Addended by: Kathrynn Ducking on: 12/13/2019 05:15 PM   Modules accepted: Orders

## 2019-12-13 NOTE — Telephone Encounter (Signed)
Pt having a lot of pain in the other leg. Took meloxicam (MOBIC) 15 MG tablet did not relief pain. Pt would like for the nurse to call her.

## 2019-12-13 NOTE — Telephone Encounter (Signed)
I called the pt back. She stated that yesterday in the office she was in "mild pain" in R leg. However last night when she went to bed the L leg started hurting and is now worse than the R. She took one dose of Meloxicam this AM and hasn't noticed an improvement. I advised she likely will need more doses to notice. Her pain is in the L knee, front and back and extending up to buttocks. She would like to be advised as she doesn't think she can take the pain for several weeks. I advised a message would be sent to Dr. Jannifer Franklin for advice.

## 2019-12-13 NOTE — Telephone Encounter (Signed)
I called the patient.  The patient indicates that she now has right leg pain, the left leg feels better.  The right leg pain is severe.  I will have her hold the Mobic for now and will do a 6-day course of prednisone and then go back to Mobic.  EMG and nerve conduction study is pending.

## 2020-01-03 DIAGNOSIS — I10 Essential (primary) hypertension: Secondary | ICD-10-CM | POA: Diagnosis not present

## 2020-01-03 DIAGNOSIS — K219 Gastro-esophageal reflux disease without esophagitis: Secondary | ICD-10-CM | POA: Diagnosis not present

## 2020-01-03 DIAGNOSIS — E7800 Pure hypercholesterolemia, unspecified: Secondary | ICD-10-CM | POA: Diagnosis not present

## 2020-01-03 DIAGNOSIS — E039 Hypothyroidism, unspecified: Secondary | ICD-10-CM | POA: Diagnosis not present

## 2020-01-03 DIAGNOSIS — M8580 Other specified disorders of bone density and structure, unspecified site: Secondary | ICD-10-CM | POA: Diagnosis not present

## 2020-01-10 ENCOUNTER — Other Ambulatory Visit: Payer: Self-pay

## 2020-01-10 ENCOUNTER — Ambulatory Visit (INDEPENDENT_AMBULATORY_CARE_PROVIDER_SITE_OTHER): Payer: Medicare HMO | Admitting: Neurology

## 2020-01-10 ENCOUNTER — Ambulatory Visit: Payer: Medicare HMO | Admitting: Neurology

## 2020-01-10 ENCOUNTER — Encounter: Payer: Self-pay | Admitting: Neurology

## 2020-01-10 DIAGNOSIS — M79604 Pain in right leg: Secondary | ICD-10-CM | POA: Diagnosis not present

## 2020-01-10 NOTE — Progress Notes (Addendum)
Patient comes in for EMG and nerve conduction study today.  No evidence of a neuropathy is seen, EMG shows only isolated mild neuropathic changes in the peroneus tertius muscle, otherwise unremarkable.  The patient is no longer noting pain down the right leg, she is only having some burning pain in the knees on both sides with weightbearing.  She will go back to her orthopedic doctor, if her right leg pain comes back on in the future, we can check MRI of the lumbar spine.    [place]    Nerve / Sites Muscle Latency Ref. Amplitude Ref. Rel Amp Segments Distance Velocity Ref. Area    ms ms mV mV %  cm m/s m/s mVms  L Peroneal - EDB     Ankle EDB 4.2 ?6.5 6.3 ?2.0 100 Ankle - EDB 9   16.6     Fib head EDB 9.8  5.4  86.6 Fib head - Ankle 26 46 ?44 14.9     Pop fossa EDB 11.7  5.3  98.1 Pop fossa - Fib head 10 53 ?44 16.3         Pop fossa - Ankle      R Peroneal - EDB     Ankle EDB 4.1 ?6.5 4.2 ?2.0 100 Ankle - EDB 9   11.1     Fib head EDB 9.9  3.3  79.5 Fib head - Ankle 26 45 ?44 9.2     Pop fossa EDB 12.0  3.3  101 Pop fossa - Fib head 10 47 ?44 9.4         Pop fossa - Ankle      L Tibial - AH     Ankle AH 4.1 ?5.8 5.0 ?4.0 100 Ankle - AH 9   9.3     Pop fossa AH 12.1  2.5  49.6 Pop fossa - Ankle 34 42 ?41 6.5  R Tibial - AH     Ankle AH 5.0 ?5.8 4.0 ?4.0 100 Ankle - AH 9   10.6     Pop fossa AH 12.4  2.7  67.9 Pop fossa - Ankle 35 48 ?41 8.6             SNC    Nerve / Sites Rec. Site Peak Lat Ref.  Amp Ref. Segments Distance    ms ms V V  cm  L Sural - Ankle (Calf)     Calf Ankle 4.2 ?4.4 6 ?6 Calf - Ankle 14  R Sural - Ankle (Calf)     Calf Ankle 3.4 ?4.4 6 ?6 Calf - Ankle 14  L Superficial peroneal - Ankle     Lat leg Ankle 3.9 ?4.4 6 ?6 Lat leg - Ankle 14  R Superficial peroneal - Ankle     Lat leg Ankle 3.8 ?4.4 3 ?6 Lat leg - Ankle 14             F  Wave    Nerve F Lat Ref.   ms ms  L Tibial - AH 52.3 ?56.0  R Tibial - AH 53.8 ?56.0

## 2020-01-10 NOTE — Procedures (Signed)
     HISTORY:  Melanie Hernandez is a 74 year old patient with a history of some right leg discomfort.  She has reported some occasional right foot numbness and tingling.  She is being evaluated for a possible neuropathy or a lumbar radiculopathy.  NERVE CONDUCTION STUDIES:  Nerve conduction studies were performed on both lower extremities. The distal motor latencies and motor amplitudes for the peroneal and posterior tibial nerves were within normal limits. The nerve conduction velocities for these nerves were also normal. The sensory latencies for the peroneal and sural nerves were within normal limits. The F wave latencies for the posterior tibial nerves were within normal limits.   EMG STUDIES:  EMG study was performed on the right lower extremity:  The tibialis anterior muscle reveals 2 to 4K motor units with full recruitment. No fibrillations or positive waves were seen. The peroneus tertius muscle reveals 2 to 5K motor units with slightly reduced recruitment. No fibrillations or positive waves were seen. The medial gastrocnemius muscle reveals 1 to 3K motor units with full recruitment. No fibrillations or positive waves were seen. The vastus lateralis muscle reveals 2 to 4K motor units with full recruitment. No fibrillations or positive waves were seen. The iliopsoas muscle reveals 2 to 4K motor units with full recruitment. No fibrillations or positive waves were seen. The biceps femoris muscle (long head) reveals 2 to 4K motor units with full recruitment. No fibrillations or positive waves were seen. The lumbosacral paraspinal muscles were tested at 3 levels, and revealed no abnormalities of insertional activity at all 3 levels tested. There was good relaxation.   IMPRESSION:  Nerve conduction studies done on both lower extremities were within normal limits.  No evidence of a neuropathy was seen.  EMG evaluation of the right lower extremity shows mild isolated chronic neuropathic  denervation in the peroneus tertius muscle, otherwise the study was unremarkable.  No clear evidence of a lumbar radiculopathy was noted.  Jill Alexanders MD 01/10/2020 2:18 PM  [facility] [address] [place], [place] [address]  Phone [phone] Fax [phone]

## 2020-01-10 NOTE — Progress Notes (Signed)
Please refer to EMG and nerve conduction procedure note.  

## 2020-01-30 DIAGNOSIS — M170 Bilateral primary osteoarthritis of knee: Secondary | ICD-10-CM | POA: Diagnosis not present

## 2020-01-30 DIAGNOSIS — M1711 Unilateral primary osteoarthritis, right knee: Secondary | ICD-10-CM | POA: Diagnosis not present

## 2020-01-30 DIAGNOSIS — M545 Low back pain: Secondary | ICD-10-CM | POA: Diagnosis not present

## 2020-01-30 DIAGNOSIS — M1712 Unilateral primary osteoarthritis, left knee: Secondary | ICD-10-CM | POA: Diagnosis not present

## 2020-02-02 DIAGNOSIS — M222X2 Patellofemoral disorders, left knee: Secondary | ICD-10-CM | POA: Diagnosis not present

## 2020-02-02 DIAGNOSIS — M47816 Spondylosis without myelopathy or radiculopathy, lumbar region: Secondary | ICD-10-CM | POA: Diagnosis not present

## 2020-02-02 DIAGNOSIS — H6121 Impacted cerumen, right ear: Secondary | ICD-10-CM | POA: Diagnosis not present

## 2020-02-02 DIAGNOSIS — H9191 Unspecified hearing loss, right ear: Secondary | ICD-10-CM | POA: Diagnosis not present

## 2020-02-02 DIAGNOSIS — M222X1 Patellofemoral disorders, right knee: Secondary | ICD-10-CM | POA: Diagnosis not present

## 2020-02-08 DIAGNOSIS — M47816 Spondylosis without myelopathy or radiculopathy, lumbar region: Secondary | ICD-10-CM | POA: Diagnosis not present

## 2020-02-08 DIAGNOSIS — M222X1 Patellofemoral disorders, right knee: Secondary | ICD-10-CM | POA: Diagnosis not present

## 2020-02-08 DIAGNOSIS — M222X2 Patellofemoral disorders, left knee: Secondary | ICD-10-CM | POA: Diagnosis not present

## 2020-02-13 DIAGNOSIS — M222X2 Patellofemoral disorders, left knee: Secondary | ICD-10-CM | POA: Diagnosis not present

## 2020-02-13 DIAGNOSIS — M47816 Spondylosis without myelopathy or radiculopathy, lumbar region: Secondary | ICD-10-CM | POA: Diagnosis not present

## 2020-02-13 DIAGNOSIS — M222X1 Patellofemoral disorders, right knee: Secondary | ICD-10-CM | POA: Diagnosis not present

## 2020-02-15 DIAGNOSIS — M222X2 Patellofemoral disorders, left knee: Secondary | ICD-10-CM | POA: Diagnosis not present

## 2020-02-15 DIAGNOSIS — M47816 Spondylosis without myelopathy or radiculopathy, lumbar region: Secondary | ICD-10-CM | POA: Diagnosis not present

## 2020-02-15 DIAGNOSIS — M222X1 Patellofemoral disorders, right knee: Secondary | ICD-10-CM | POA: Diagnosis not present

## 2020-02-16 DIAGNOSIS — K2960 Other gastritis without bleeding: Secondary | ICD-10-CM | POA: Diagnosis not present

## 2020-02-16 DIAGNOSIS — T39395A Adverse effect of other nonsteroidal anti-inflammatory drugs [NSAID], initial encounter: Secondary | ICD-10-CM | POA: Diagnosis not present

## 2020-02-16 DIAGNOSIS — M8949 Other hypertrophic osteoarthropathy, multiple sites: Secondary | ICD-10-CM | POA: Diagnosis not present

## 2020-02-21 DIAGNOSIS — M47816 Spondylosis without myelopathy or radiculopathy, lumbar region: Secondary | ICD-10-CM | POA: Diagnosis not present

## 2020-02-21 DIAGNOSIS — M222X2 Patellofemoral disorders, left knee: Secondary | ICD-10-CM | POA: Diagnosis not present

## 2020-02-21 DIAGNOSIS — M222X1 Patellofemoral disorders, right knee: Secondary | ICD-10-CM | POA: Diagnosis not present

## 2020-02-23 DIAGNOSIS — M47816 Spondylosis without myelopathy or radiculopathy, lumbar region: Secondary | ICD-10-CM | POA: Diagnosis not present

## 2020-02-23 DIAGNOSIS — M222X2 Patellofemoral disorders, left knee: Secondary | ICD-10-CM | POA: Diagnosis not present

## 2020-02-23 DIAGNOSIS — M222X1 Patellofemoral disorders, right knee: Secondary | ICD-10-CM | POA: Diagnosis not present

## 2020-02-29 DIAGNOSIS — M170 Bilateral primary osteoarthritis of knee: Secondary | ICD-10-CM | POA: Diagnosis not present

## 2020-02-29 DIAGNOSIS — M47816 Spondylosis without myelopathy or radiculopathy, lumbar region: Secondary | ICD-10-CM | POA: Diagnosis not present

## 2020-02-29 DIAGNOSIS — M222X1 Patellofemoral disorders, right knee: Secondary | ICD-10-CM | POA: Diagnosis not present

## 2020-02-29 DIAGNOSIS — M222X2 Patellofemoral disorders, left knee: Secondary | ICD-10-CM | POA: Diagnosis not present

## 2020-03-02 DIAGNOSIS — M222X1 Patellofemoral disorders, right knee: Secondary | ICD-10-CM | POA: Diagnosis not present

## 2020-03-02 DIAGNOSIS — M47816 Spondylosis without myelopathy or radiculopathy, lumbar region: Secondary | ICD-10-CM | POA: Diagnosis not present

## 2020-03-02 DIAGNOSIS — Z23 Encounter for immunization: Secondary | ICD-10-CM | POA: Diagnosis not present

## 2020-03-02 DIAGNOSIS — M222X2 Patellofemoral disorders, left knee: Secondary | ICD-10-CM | POA: Diagnosis not present

## 2020-03-07 DIAGNOSIS — M222X2 Patellofemoral disorders, left knee: Secondary | ICD-10-CM | POA: Diagnosis not present

## 2020-03-07 DIAGNOSIS — M222X1 Patellofemoral disorders, right knee: Secondary | ICD-10-CM | POA: Diagnosis not present

## 2020-03-07 DIAGNOSIS — M47816 Spondylosis without myelopathy or radiculopathy, lumbar region: Secondary | ICD-10-CM | POA: Diagnosis not present

## 2020-03-08 DIAGNOSIS — E7800 Pure hypercholesterolemia, unspecified: Secondary | ICD-10-CM | POA: Diagnosis not present

## 2020-03-08 DIAGNOSIS — E039 Hypothyroidism, unspecified: Secondary | ICD-10-CM | POA: Diagnosis not present

## 2020-03-08 DIAGNOSIS — I10 Essential (primary) hypertension: Secondary | ICD-10-CM | POA: Diagnosis not present

## 2020-03-08 DIAGNOSIS — M8580 Other specified disorders of bone density and structure, unspecified site: Secondary | ICD-10-CM | POA: Diagnosis not present

## 2020-03-08 DIAGNOSIS — K219 Gastro-esophageal reflux disease without esophagitis: Secondary | ICD-10-CM | POA: Diagnosis not present

## 2020-03-08 DIAGNOSIS — N1831 Chronic kidney disease, stage 3a: Secondary | ICD-10-CM | POA: Diagnosis not present

## 2020-03-09 DIAGNOSIS — M222X2 Patellofemoral disorders, left knee: Secondary | ICD-10-CM | POA: Diagnosis not present

## 2020-03-09 DIAGNOSIS — M47816 Spondylosis without myelopathy or radiculopathy, lumbar region: Secondary | ICD-10-CM | POA: Diagnosis not present

## 2020-03-09 DIAGNOSIS — M222X1 Patellofemoral disorders, right knee: Secondary | ICD-10-CM | POA: Diagnosis not present

## 2020-03-19 DIAGNOSIS — N1831 Chronic kidney disease, stage 3a: Secondary | ICD-10-CM | POA: Diagnosis not present

## 2020-03-19 DIAGNOSIS — I10 Essential (primary) hypertension: Secondary | ICD-10-CM | POA: Diagnosis not present

## 2020-03-19 DIAGNOSIS — R7301 Impaired fasting glucose: Secondary | ICD-10-CM | POA: Diagnosis not present

## 2020-03-19 DIAGNOSIS — K219 Gastro-esophageal reflux disease without esophagitis: Secondary | ICD-10-CM | POA: Diagnosis not present

## 2020-04-10 DIAGNOSIS — N1831 Chronic kidney disease, stage 3a: Secondary | ICD-10-CM | POA: Diagnosis not present

## 2020-04-10 DIAGNOSIS — K219 Gastro-esophageal reflux disease without esophagitis: Secondary | ICD-10-CM | POA: Diagnosis not present

## 2020-04-10 DIAGNOSIS — I10 Essential (primary) hypertension: Secondary | ICD-10-CM | POA: Diagnosis not present

## 2020-04-10 DIAGNOSIS — M8580 Other specified disorders of bone density and structure, unspecified site: Secondary | ICD-10-CM | POA: Diagnosis not present

## 2020-04-10 DIAGNOSIS — E7800 Pure hypercholesterolemia, unspecified: Secondary | ICD-10-CM | POA: Diagnosis not present

## 2020-04-10 DIAGNOSIS — E039 Hypothyroidism, unspecified: Secondary | ICD-10-CM | POA: Diagnosis not present

## 2020-05-14 ENCOUNTER — Ambulatory Visit: Payer: Medicare HMO | Admitting: Neurology

## 2020-06-19 DIAGNOSIS — M8580 Other specified disorders of bone density and structure, unspecified site: Secondary | ICD-10-CM | POA: Diagnosis not present

## 2020-06-19 DIAGNOSIS — K219 Gastro-esophageal reflux disease without esophagitis: Secondary | ICD-10-CM | POA: Diagnosis not present

## 2020-06-19 DIAGNOSIS — E7800 Pure hypercholesterolemia, unspecified: Secondary | ICD-10-CM | POA: Diagnosis not present

## 2020-06-19 DIAGNOSIS — I10 Essential (primary) hypertension: Secondary | ICD-10-CM | POA: Diagnosis not present

## 2020-06-19 DIAGNOSIS — N1831 Chronic kidney disease, stage 3a: Secondary | ICD-10-CM | POA: Diagnosis not present

## 2020-06-19 DIAGNOSIS — E039 Hypothyroidism, unspecified: Secondary | ICD-10-CM | POA: Diagnosis not present

## 2020-06-29 DIAGNOSIS — Z01419 Encounter for gynecological examination (general) (routine) without abnormal findings: Secondary | ICD-10-CM | POA: Diagnosis not present

## 2020-10-16 DIAGNOSIS — L309 Dermatitis, unspecified: Secondary | ICD-10-CM | POA: Diagnosis not present

## 2020-10-16 DIAGNOSIS — Z1389 Encounter for screening for other disorder: Secondary | ICD-10-CM | POA: Diagnosis not present

## 2020-10-16 DIAGNOSIS — E7800 Pure hypercholesterolemia, unspecified: Secondary | ICD-10-CM | POA: Diagnosis not present

## 2020-10-16 DIAGNOSIS — K219 Gastro-esophageal reflux disease without esophagitis: Secondary | ICD-10-CM | POA: Diagnosis not present

## 2020-10-16 DIAGNOSIS — Z0000 Encounter for general adult medical examination without abnormal findings: Secondary | ICD-10-CM | POA: Diagnosis not present

## 2020-10-16 DIAGNOSIS — E039 Hypothyroidism, unspecified: Secondary | ICD-10-CM | POA: Diagnosis not present

## 2020-10-16 DIAGNOSIS — I10 Essential (primary) hypertension: Secondary | ICD-10-CM | POA: Diagnosis not present

## 2020-10-16 DIAGNOSIS — M79675 Pain in left toe(s): Secondary | ICD-10-CM | POA: Diagnosis not present

## 2020-10-16 DIAGNOSIS — M8580 Other specified disorders of bone density and structure, unspecified site: Secondary | ICD-10-CM | POA: Diagnosis not present

## 2020-10-31 DIAGNOSIS — E039 Hypothyroidism, unspecified: Secondary | ICD-10-CM | POA: Diagnosis not present

## 2020-10-31 DIAGNOSIS — K219 Gastro-esophageal reflux disease without esophagitis: Secondary | ICD-10-CM | POA: Diagnosis not present

## 2020-10-31 DIAGNOSIS — M8580 Other specified disorders of bone density and structure, unspecified site: Secondary | ICD-10-CM | POA: Diagnosis not present

## 2020-10-31 DIAGNOSIS — E7800 Pure hypercholesterolemia, unspecified: Secondary | ICD-10-CM | POA: Diagnosis not present

## 2020-10-31 DIAGNOSIS — I10 Essential (primary) hypertension: Secondary | ICD-10-CM | POA: Diagnosis not present

## 2020-10-31 DIAGNOSIS — N1831 Chronic kidney disease, stage 3a: Secondary | ICD-10-CM | POA: Diagnosis not present

## 2020-12-11 DIAGNOSIS — K219 Gastro-esophageal reflux disease without esophagitis: Secondary | ICD-10-CM | POA: Diagnosis not present

## 2020-12-11 DIAGNOSIS — N1831 Chronic kidney disease, stage 3a: Secondary | ICD-10-CM | POA: Diagnosis not present

## 2020-12-11 DIAGNOSIS — I10 Essential (primary) hypertension: Secondary | ICD-10-CM | POA: Diagnosis not present

## 2020-12-11 DIAGNOSIS — E039 Hypothyroidism, unspecified: Secondary | ICD-10-CM | POA: Diagnosis not present

## 2020-12-11 DIAGNOSIS — M8580 Other specified disorders of bone density and structure, unspecified site: Secondary | ICD-10-CM | POA: Diagnosis not present

## 2020-12-11 DIAGNOSIS — E7800 Pure hypercholesterolemia, unspecified: Secondary | ICD-10-CM | POA: Diagnosis not present

## 2021-01-07 DIAGNOSIS — W010XXA Fall on same level from slipping, tripping and stumbling without subsequent striking against object, initial encounter: Secondary | ICD-10-CM | POA: Diagnosis not present

## 2021-01-07 DIAGNOSIS — M25551 Pain in right hip: Secondary | ICD-10-CM | POA: Diagnosis not present

## 2021-01-07 DIAGNOSIS — M25531 Pain in right wrist: Secondary | ICD-10-CM | POA: Diagnosis not present

## 2021-01-16 DIAGNOSIS — M545 Low back pain, unspecified: Secondary | ICD-10-CM | POA: Diagnosis not present

## 2021-01-24 DIAGNOSIS — R2689 Other abnormalities of gait and mobility: Secondary | ICD-10-CM | POA: Diagnosis not present

## 2021-01-24 DIAGNOSIS — M25551 Pain in right hip: Secondary | ICD-10-CM | POA: Diagnosis not present

## 2021-01-24 DIAGNOSIS — R531 Weakness: Secondary | ICD-10-CM | POA: Diagnosis not present

## 2021-01-24 DIAGNOSIS — M25651 Stiffness of right hip, not elsewhere classified: Secondary | ICD-10-CM | POA: Diagnosis not present

## 2021-01-28 DIAGNOSIS — M25651 Stiffness of right hip, not elsewhere classified: Secondary | ICD-10-CM | POA: Diagnosis not present

## 2021-01-28 DIAGNOSIS — R531 Weakness: Secondary | ICD-10-CM | POA: Diagnosis not present

## 2021-01-28 DIAGNOSIS — M25551 Pain in right hip: Secondary | ICD-10-CM | POA: Diagnosis not present

## 2021-01-28 DIAGNOSIS — R2689 Other abnormalities of gait and mobility: Secondary | ICD-10-CM | POA: Diagnosis not present

## 2021-01-29 ENCOUNTER — Other Ambulatory Visit: Payer: Self-pay | Admitting: Orthopaedic Surgery

## 2021-01-29 DIAGNOSIS — M545 Low back pain, unspecified: Secondary | ICD-10-CM

## 2021-01-30 DIAGNOSIS — R531 Weakness: Secondary | ICD-10-CM | POA: Diagnosis not present

## 2021-01-30 DIAGNOSIS — R2689 Other abnormalities of gait and mobility: Secondary | ICD-10-CM | POA: Diagnosis not present

## 2021-01-30 DIAGNOSIS — M25651 Stiffness of right hip, not elsewhere classified: Secondary | ICD-10-CM | POA: Diagnosis not present

## 2021-01-30 DIAGNOSIS — M25551 Pain in right hip: Secondary | ICD-10-CM | POA: Diagnosis not present

## 2021-02-04 DIAGNOSIS — M25551 Pain in right hip: Secondary | ICD-10-CM | POA: Diagnosis not present

## 2021-02-04 DIAGNOSIS — M25651 Stiffness of right hip, not elsewhere classified: Secondary | ICD-10-CM | POA: Diagnosis not present

## 2021-02-04 DIAGNOSIS — R2689 Other abnormalities of gait and mobility: Secondary | ICD-10-CM | POA: Diagnosis not present

## 2021-02-04 DIAGNOSIS — R531 Weakness: Secondary | ICD-10-CM | POA: Diagnosis not present

## 2021-02-06 DIAGNOSIS — M25651 Stiffness of right hip, not elsewhere classified: Secondary | ICD-10-CM | POA: Diagnosis not present

## 2021-02-06 DIAGNOSIS — M25551 Pain in right hip: Secondary | ICD-10-CM | POA: Diagnosis not present

## 2021-02-06 DIAGNOSIS — R531 Weakness: Secondary | ICD-10-CM | POA: Diagnosis not present

## 2021-02-06 DIAGNOSIS — R2689 Other abnormalities of gait and mobility: Secondary | ICD-10-CM | POA: Diagnosis not present

## 2021-02-09 DIAGNOSIS — H40033 Anatomical narrow angle, bilateral: Secondary | ICD-10-CM | POA: Diagnosis not present

## 2021-02-09 DIAGNOSIS — H10023 Other mucopurulent conjunctivitis, bilateral: Secondary | ICD-10-CM | POA: Diagnosis not present

## 2021-02-11 DIAGNOSIS — M545 Low back pain, unspecified: Secondary | ICD-10-CM | POA: Diagnosis not present

## 2021-02-12 DIAGNOSIS — R2689 Other abnormalities of gait and mobility: Secondary | ICD-10-CM | POA: Diagnosis not present

## 2021-02-12 DIAGNOSIS — R531 Weakness: Secondary | ICD-10-CM | POA: Diagnosis not present

## 2021-02-12 DIAGNOSIS — M25651 Stiffness of right hip, not elsewhere classified: Secondary | ICD-10-CM | POA: Diagnosis not present

## 2021-02-12 DIAGNOSIS — M25551 Pain in right hip: Secondary | ICD-10-CM | POA: Diagnosis not present

## 2021-02-15 DIAGNOSIS — M25551 Pain in right hip: Secondary | ICD-10-CM | POA: Diagnosis not present

## 2021-02-15 DIAGNOSIS — R2689 Other abnormalities of gait and mobility: Secondary | ICD-10-CM | POA: Diagnosis not present

## 2021-02-15 DIAGNOSIS — M25651 Stiffness of right hip, not elsewhere classified: Secondary | ICD-10-CM | POA: Diagnosis not present

## 2021-02-15 DIAGNOSIS — R531 Weakness: Secondary | ICD-10-CM | POA: Diagnosis not present

## 2021-02-19 DIAGNOSIS — M25651 Stiffness of right hip, not elsewhere classified: Secondary | ICD-10-CM | POA: Diagnosis not present

## 2021-02-19 DIAGNOSIS — R2689 Other abnormalities of gait and mobility: Secondary | ICD-10-CM | POA: Diagnosis not present

## 2021-02-19 DIAGNOSIS — R531 Weakness: Secondary | ICD-10-CM | POA: Diagnosis not present

## 2021-02-19 DIAGNOSIS — M25551 Pain in right hip: Secondary | ICD-10-CM | POA: Diagnosis not present

## 2021-02-22 DIAGNOSIS — M25551 Pain in right hip: Secondary | ICD-10-CM | POA: Diagnosis not present

## 2021-02-22 DIAGNOSIS — R531 Weakness: Secondary | ICD-10-CM | POA: Diagnosis not present

## 2021-02-22 DIAGNOSIS — R2689 Other abnormalities of gait and mobility: Secondary | ICD-10-CM | POA: Diagnosis not present

## 2021-02-22 DIAGNOSIS — M25651 Stiffness of right hip, not elsewhere classified: Secondary | ICD-10-CM | POA: Diagnosis not present

## 2021-02-26 DIAGNOSIS — N1831 Chronic kidney disease, stage 3a: Secondary | ICD-10-CM | POA: Diagnosis not present

## 2021-02-26 DIAGNOSIS — K219 Gastro-esophageal reflux disease without esophagitis: Secondary | ICD-10-CM | POA: Diagnosis not present

## 2021-02-26 DIAGNOSIS — E7800 Pure hypercholesterolemia, unspecified: Secondary | ICD-10-CM | POA: Diagnosis not present

## 2021-02-26 DIAGNOSIS — E039 Hypothyroidism, unspecified: Secondary | ICD-10-CM | POA: Diagnosis not present

## 2021-02-26 DIAGNOSIS — I10 Essential (primary) hypertension: Secondary | ICD-10-CM | POA: Diagnosis not present

## 2021-02-26 DIAGNOSIS — M8580 Other specified disorders of bone density and structure, unspecified site: Secondary | ICD-10-CM | POA: Diagnosis not present

## 2021-02-27 DIAGNOSIS — Z23 Encounter for immunization: Secondary | ICD-10-CM | POA: Diagnosis not present

## 2021-02-28 DIAGNOSIS — R2689 Other abnormalities of gait and mobility: Secondary | ICD-10-CM | POA: Diagnosis not present

## 2021-02-28 DIAGNOSIS — M25551 Pain in right hip: Secondary | ICD-10-CM | POA: Diagnosis not present

## 2021-02-28 DIAGNOSIS — R531 Weakness: Secondary | ICD-10-CM | POA: Diagnosis not present

## 2021-02-28 DIAGNOSIS — M25651 Stiffness of right hip, not elsewhere classified: Secondary | ICD-10-CM | POA: Diagnosis not present

## 2021-03-13 DIAGNOSIS — M545 Low back pain, unspecified: Secondary | ICD-10-CM | POA: Diagnosis not present

## 2021-03-18 DIAGNOSIS — M545 Low back pain, unspecified: Secondary | ICD-10-CM | POA: Diagnosis not present

## 2021-03-22 ENCOUNTER — Other Ambulatory Visit: Payer: Self-pay | Admitting: Orthopaedic Surgery

## 2021-03-22 DIAGNOSIS — M8448XA Pathological fracture, other site, initial encounter for fracture: Secondary | ICD-10-CM

## 2021-04-03 ENCOUNTER — Ambulatory Visit
Admission: RE | Admit: 2021-04-03 | Discharge: 2021-04-03 | Disposition: A | Discharge status: Home / Self Care | Payer: Medicare HMO | Source: Ambulatory Visit | Attending: Orthopaedic Surgery | Admitting: Orthopaedic Surgery

## 2021-04-03 ENCOUNTER — Other Ambulatory Visit: Payer: Self-pay

## 2021-04-03 DIAGNOSIS — M4848XA Fatigue fracture of vertebra, sacral and sacrococcygeal region, initial encounter for fracture: Secondary | ICD-10-CM | POA: Diagnosis not present

## 2021-04-03 DIAGNOSIS — M8448XA Pathological fracture, other site, initial encounter for fracture: Secondary | ICD-10-CM

## 2021-04-03 NOTE — Consult Note (Signed)
Chief Complaint:   [Sacral Fractures. Pain.] HISTORY OF PRESENT ILLNESS: [.] MEDICATIONS AND MEDICAL HISTORY: Medications: [ Omeprozole Levothyroxine  Simvastatin Famatidine Amlodipine  ]   Allergies: [None]   Past Medical History: [Hypothyroidism.  Hypertension.]   Surgical History: [Cholecystectomy.  Cyst removal.]   Social History: Melanie Hernandez is widowed.  Melanie Hernandez denies use of tobacco or alcohol.  Her daughter and son-in-law head recently moved in with her.] REVIEW OF SYSTEMS: [Patient is sitting in a chair in no acute distress.]   Exam:       [Patient does not have pain with pressure over the posterior sacrum on either side.  Sacral pain is not exacerbated with extension or rotation.]   Data Review: [MRI of the lumbar spin Patient was seen in consultation today for No chief complaint on file.  at the request of Dalldorf,Peter  Referring Physician(s): [place] Physician: Jobe Igo   History of Present Illness: MARSELLA Hernandez is a 74 y.o. female The patient fell during exercise class3 months ago.  Melanie Hernandez has had right posterior pelvic and groin pain since that time.  Pain is greatest when Melanie Hernandez first starts walking although Melanie Hernandez does not have exacerbation of the pain when standing from a sitting position or getting up from a recumbent position.  He is able or most activities of daily living.  Melanie Hernandez was doing yard work yesterday including baggy in leaves which exacerbated the groin pain.  Melanie Hernandez describes her pain is light with medication.  Melanie Hernandez is using over the counter NSAIDs.   Melanie Hernandez rates her pain 4/10 on a visual analog scale.  Melanie Hernandez scored 7/24 on the [employer] disability questionnaire.   Melanie Hernandez states that her pain is improved compared 1 month ago.  Melanie Hernandez occasionally uses a cane in her left hand  Past Medical History:  Diagnosis Date   GERD (gastroesophageal reflux disease)    High cholesterol    Hypothyroidism    UTI (lower urinary tract infection)     being tx now   Wears glasses    Wears partial dentures    top-bottom-    Past Surgical History:  Procedure Laterality Date   breast cysts  1970s   "it probably was both" had problems with milk ducts   COLONOSCOPY     LAPAROSCOPIC CHOLECYSTECTOMY  2004   ORIF ANKLE FRACTURE  03/23/2012   Procedure: OPEN REDUCTION INTERNAL FIXATION (ORIF) ANKLE FRACTURE;  Surgeon: Hessie Dibble, MD;  Location: [facility];  Service: Orthopedics;  Laterality: Right;   PILONIDAL CYST EXCISION  1960s   TUBAL LIGATION      Allergies: Patient has no known allergies.  Medications: Prior to Admission medications   Medication Sig Start Date End Date Taking? Authorizing Provider  amLODipine (NORVASC) 10 MG tablet Take 10 mg by mouth daily.    [provider]  aspirin 81 MG tablet Take 81 mg by mouth daily. Patient not taking: Reported on 04/03/2021    [provider]  calcium carbonate (OS-CAL) 600 MG TABS Take 600 mg by mouth daily.     [provider]  famotidine (PEPCID) 40 MG tablet Take 40 mg by mouth daily.    [provider]  HYDROcodone-acetaminophen (VICODIN) 5-500 MG per tablet Take 1 tablet by mouth every 6 (six) hours as needed.    [provider]  levothyroxine (SYNTHROID, LEVOTHROID) 75 MCG tablet Take 75 mcg by mouth daily.    [provider]  meloxicam (MOBIC) 15 MG tablet Take  1 tablet (15 mg total) by mouth daily. 12/12/19   Kathrynn Ducking, MD  omeprazole (PRILOSEC OTC) 20 MG tablet Take 20 mg by mouth daily.    [provider]  predniSONE (DELTASONE) 5 MG tablet Begin taking 6 tablets daily, taper by one tablet daily until off the medication. 12/13/19   Kathrynn Ducking, MD  Probiotic Product (PROBIOTIC PO) Take by mouth daily.     [provider]  simvastatin (ZOCOR) 20 MG tablet Take 20 mg by mouth daily.    [provider]     Family History  Problem Relation Age of Onset   Stroke  Mother    Other Father        "cardiac respiratory arrest"   Heart disease Father        "clogged arteries"    Social History   Socioeconomic History   Marital status: Widowed    Spouse name: Not on file   Number of children: 3   Years of education: Not on file   Highest education level: High school graduate  Occupational History   Not on file  Tobacco Use   Smoking status: Former    Types: Cigarettes    Quit date: 03/22/1981    Years since quitting: 40.0   Smokeless tobacco: Never  Vaping Use   Vaping Use: Never used  Substance and Sexual Activity   Alcohol use: Yes    Comment: 1-2 per month   Drug use: No   Sexual activity: Not on file  Other Topics Concern   Not on file  Social History Narrative   Lives alone   Right handed   Caffeine: 1-2 cups/coffee   Social Determinants of Health   Financial Resource Strain: Not on file  Food Insecurity: Not on file  Transportation Needs: Not on file  Physical Activity: Not on file  Stress: Not on file  Social Connections: Not on file    ECOG Status: 0 - Asymptomatic  Review of Systems: A 12 point ROS discussed and pertinent positives are indicated in the HPI above.  All other systems are negative.  Review of Systems  Constitutional:  Positive for activity change.  HENT: Negative.    Eyes: Negative.   Respiratory: Negative.    Cardiovascular: Negative.   Gastrointestinal: Negative.   Endocrine: Negative.   Genitourinary: Negative.   Musculoskeletal: Negative.   Skin: Negative.   Neurological:  Positive for numbness.  Hematological: Negative.   Psychiatric/Behavioral: Negative.     Vital Signs: BP (!) 160/84 (BP Location: Right Arm, Patient Position: Sitting, Cuff Size: Normal)   Pulse 78   Temp 97.6 F (36.4 C) (Oral)   Wt 87.5 kg   SpO2 98% Comment: room air  BMI 34.19 kg/m   Physical Exam Constitutional:      General: Melanie Hernandez is not in acute distress.    Appearance: Normal appearance. Melanie Hernandez is not  ill-appearing.  Musculoskeletal:     Comments: Low back pain with extension.  No pain to palpation over the SI joints.  Neurological:     Mental Status: Melanie Hernandez is alert.    Imaging: No results found.  Labs:  CBC: No results for input(s): WBC, HGB, HCT, PLT in the last 8760 hours.  COAGS: No results for input(s): INR, APTT in the last 8760 hours.  BMP: No results for input(s): NA, K, CL, CO2, GLUCOSE, BUN, CALCIUM, CREATININE, GFRNONAA, GFRAA in the last 8760 hours.  Invalid input(s): CMP  LIVER FUNCTION TESTS: No results  for input(s): BILITOT, AST, ALT, ALKPHOS, PROT, ALBUMIN in the last 8760 hours.  TUMOR MARKERS: No results for input(s): AFPTM, CEA, CA199, CHROMGRNA in the last 8760 hours.  Assessment and Plan:  [ Healing of bilateral sacral insufficiency fractures, right greater than left.  Given the patient's level activity and minimal pain managed with over the counter NSAIDs, Melanie Hernandez is on likely to have significant benefit in progression of healing from sacroplasty.  The benefits do not outweigh the risks.  Recommend conservative therapy.  If the pain becomes worse or shifts to the left, I would be happy to see her again for possible sacroplasty.  I discussed this with the patient and her daughter Claiborne Billings who understood and are agreement.  I answered all questions.   Thank you for this interesting consult.  I greatly enjoyed meeting Melanie Hernandez and look forward to participating in their care.  A copy of this report was sent to the requesting provider on this date.  Electronically Signed: Arna Snipe, MD 04/03/2021, 12:44 PM   I spent a total of  15 Minutes  in face to face in clinical consultation, greater than 50% of which was counseling/coordinating care for her sacral fracture

## 2021-04-22 DIAGNOSIS — R051 Acute cough: Secondary | ICD-10-CM | POA: Diagnosis not present

## 2021-04-22 DIAGNOSIS — J0110 Acute frontal sinusitis, unspecified: Secondary | ICD-10-CM | POA: Diagnosis not present

## 2021-05-09 DIAGNOSIS — R051 Acute cough: Secondary | ICD-10-CM | POA: Diagnosis not present

## 2021-05-29 DIAGNOSIS — S3210XD Unspecified fracture of sacrum, subsequent encounter for fracture with routine healing: Secondary | ICD-10-CM | POA: Diagnosis not present

## 2021-05-29 DIAGNOSIS — R350 Frequency of micturition: Secondary | ICD-10-CM | POA: Diagnosis not present

## 2021-05-29 DIAGNOSIS — N3000 Acute cystitis without hematuria: Secondary | ICD-10-CM | POA: Diagnosis not present

## 2021-05-29 DIAGNOSIS — R3915 Urgency of urination: Secondary | ICD-10-CM | POA: Diagnosis not present

## 2021-06-14 DIAGNOSIS — M8589 Other specified disorders of bone density and structure, multiple sites: Secondary | ICD-10-CM | POA: Diagnosis not present

## 2021-06-19 DIAGNOSIS — N3000 Acute cystitis without hematuria: Secondary | ICD-10-CM | POA: Diagnosis not present

## 2021-07-08 DIAGNOSIS — L570 Actinic keratosis: Secondary | ICD-10-CM | POA: Diagnosis not present

## 2021-07-08 DIAGNOSIS — L814 Other melanin hyperpigmentation: Secondary | ICD-10-CM | POA: Diagnosis not present

## 2021-07-08 DIAGNOSIS — D485 Neoplasm of uncertain behavior of skin: Secondary | ICD-10-CM | POA: Diagnosis not present

## 2021-07-08 DIAGNOSIS — D225 Melanocytic nevi of trunk: Secondary | ICD-10-CM | POA: Diagnosis not present

## 2021-07-15 DIAGNOSIS — N3000 Acute cystitis without hematuria: Secondary | ICD-10-CM | POA: Diagnosis not present

## 2021-07-30 DIAGNOSIS — L821 Other seborrheic keratosis: Secondary | ICD-10-CM | POA: Diagnosis not present

## 2021-07-30 DIAGNOSIS — L570 Actinic keratosis: Secondary | ICD-10-CM | POA: Diagnosis not present

## 2021-08-29 DIAGNOSIS — K219 Gastro-esophageal reflux disease without esophagitis: Secondary | ICD-10-CM | POA: Diagnosis not present

## 2021-08-29 DIAGNOSIS — I10 Essential (primary) hypertension: Secondary | ICD-10-CM | POA: Diagnosis not present

## 2021-08-29 DIAGNOSIS — N1831 Chronic kidney disease, stage 3a: Secondary | ICD-10-CM | POA: Diagnosis not present

## 2021-08-29 DIAGNOSIS — E7800 Pure hypercholesterolemia, unspecified: Secondary | ICD-10-CM | POA: Diagnosis not present

## 2021-08-29 DIAGNOSIS — E039 Hypothyroidism, unspecified: Secondary | ICD-10-CM | POA: Diagnosis not present

## 2021-09-11 IMAGING — US US PELVIS COMPLETE WITH TRANSVAGINAL
1 series · 13 of 25 positions shown · non-contrast
Comparison: None

CLINICAL DATA: Severe gas and bloating.



[Series 1: us pelvis complete with transvaginal · 0.25mm/px · 13 of 34 slices shown]
[im 1/34]
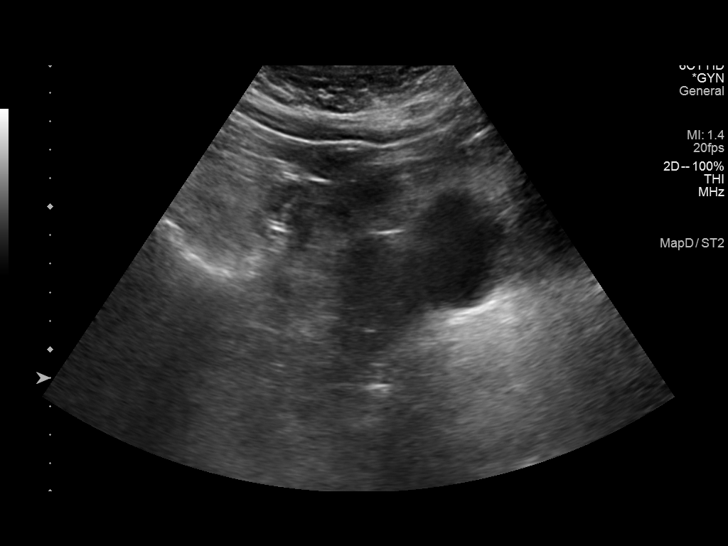
[im 3/34]
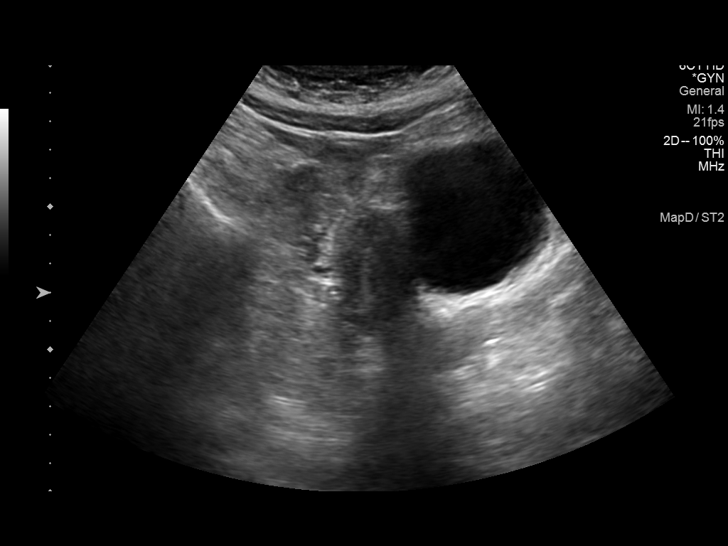
[im 6/34]
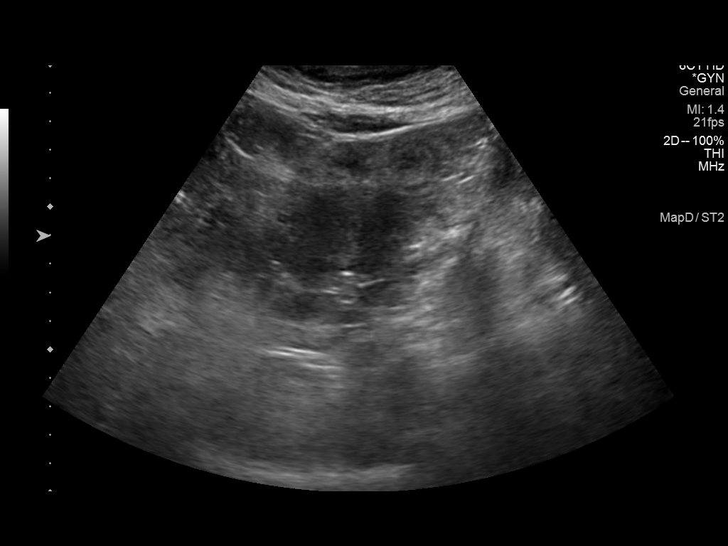
[im 9/34]
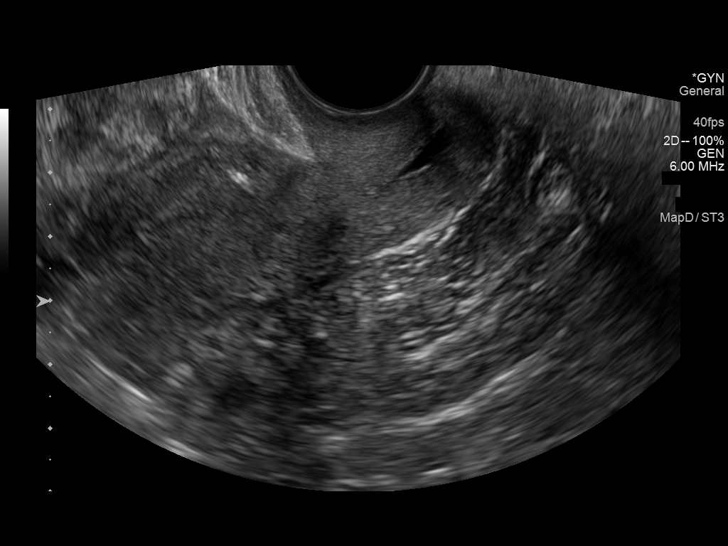
[im 12/34]
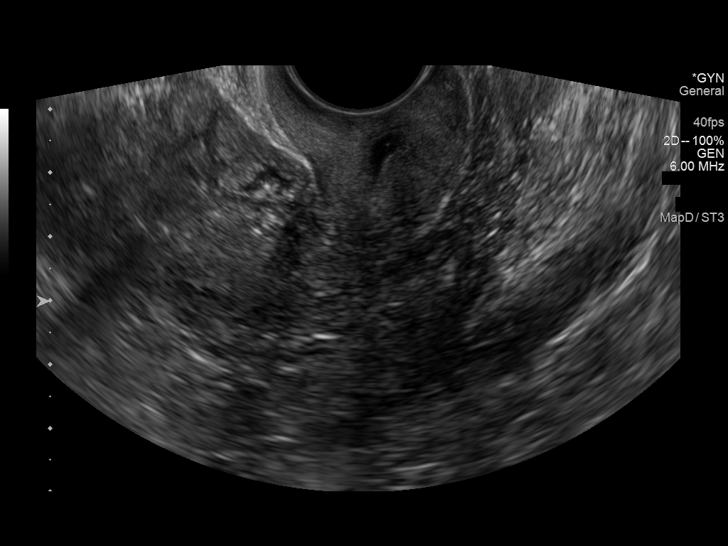
[im 14/34]
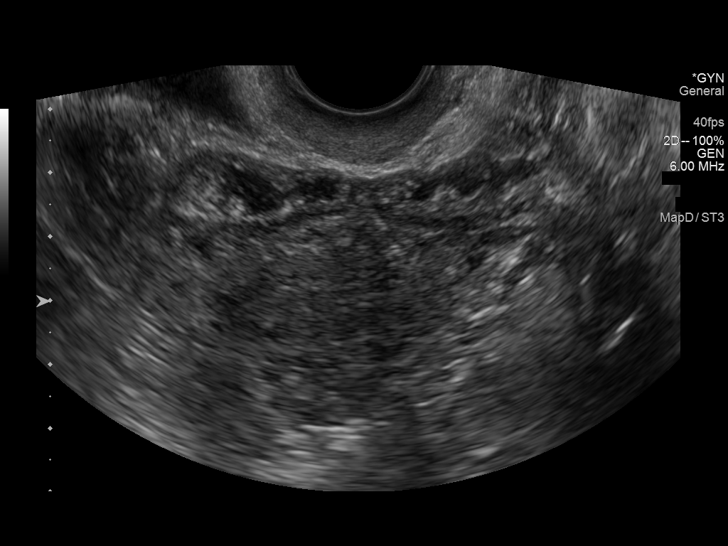
[im 17/34]
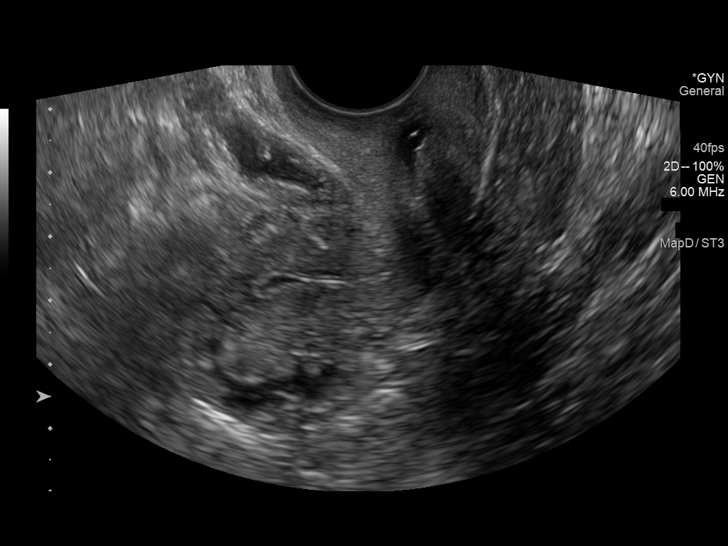
[im 20/34]
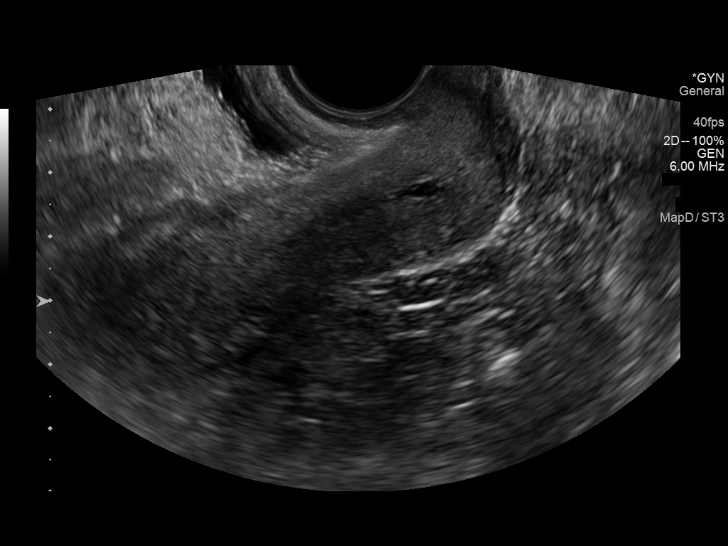
[im 23/34]
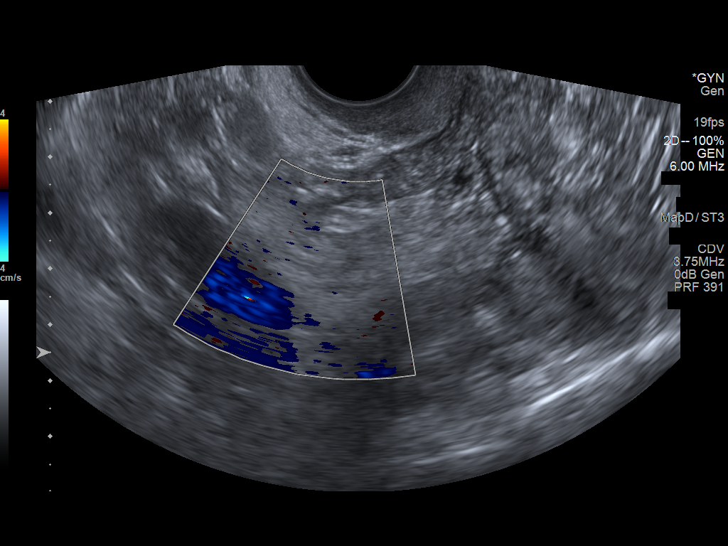
[im 25/34]
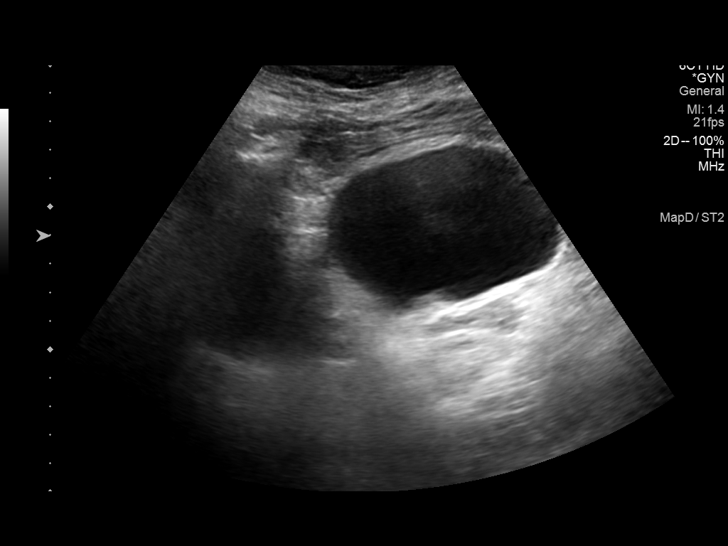
[im 28/34]
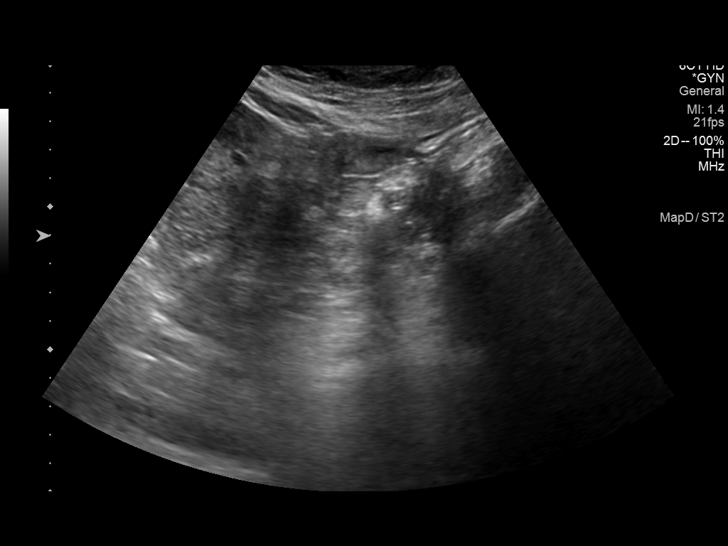
[im 31/34]
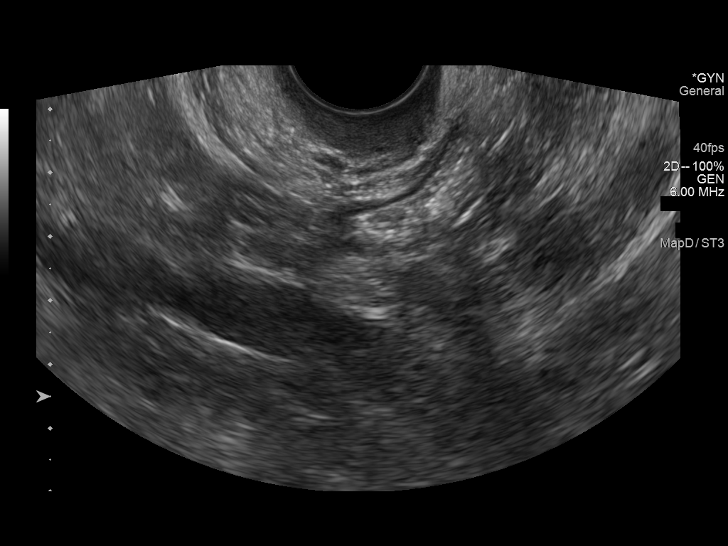
[im 34/34]
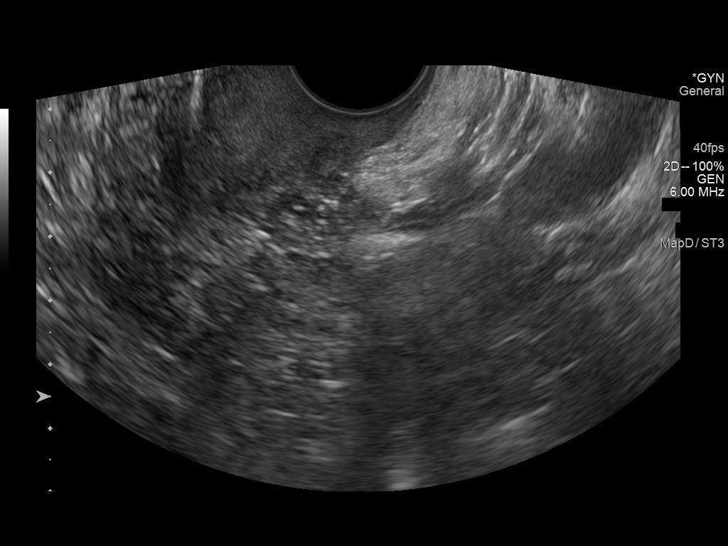

[13 of 25 positions shown; findings below may reference images not displayed]

FINDINGS: Uterus

Measurements: 6.6 x 2.6 x 4.0 cm = volume: 36 mL. No fibroids or
other mass visualized.

Endometrium

Thickness: Double-layer thickness is 3 mm. No focal abnormality
visualized. Trace fluid noted in the endometrial canal.

Right ovary

Measurements: Not visualized.

Left ovary

Measurements: Not visualized

Other findings

No intraperitoneal free fluid.
IMPRESSION: 3 mm double-layer endometrial stripe thickness with trace free fluid
in the endometrial canal. Fluid in the endometrial cavity is
abnormal in a postmenopausal female although given the thin
endometrium, this may be related to atrophy. Correlation for
abnormal vaginal bleeding recommended. Gyn consultation may be
warranted.

## 2021-09-24 DIAGNOSIS — L570 Actinic keratosis: Secondary | ICD-10-CM | POA: Diagnosis not present

## 2021-10-16 DIAGNOSIS — M545 Low back pain, unspecified: Secondary | ICD-10-CM | POA: Diagnosis not present

## 2021-10-16 DIAGNOSIS — R399 Unspecified symptoms and signs involving the genitourinary system: Secondary | ICD-10-CM | POA: Diagnosis not present

## 2021-10-16 DIAGNOSIS — R350 Frequency of micturition: Secondary | ICD-10-CM | POA: Diagnosis not present

## 2021-10-17 DIAGNOSIS — M79671 Pain in right foot: Secondary | ICD-10-CM | POA: Diagnosis not present

## 2021-10-17 DIAGNOSIS — M4316 Spondylolisthesis, lumbar region: Secondary | ICD-10-CM | POA: Diagnosis not present

## 2021-10-22 DIAGNOSIS — M47896 Other spondylosis, lumbar region: Secondary | ICD-10-CM | POA: Diagnosis not present

## 2021-10-28 DIAGNOSIS — M47896 Other spondylosis, lumbar region: Secondary | ICD-10-CM | POA: Diagnosis not present

## 2021-10-29 DIAGNOSIS — R350 Frequency of micturition: Secondary | ICD-10-CM | POA: Diagnosis not present

## 2021-10-29 DIAGNOSIS — R399 Unspecified symptoms and signs involving the genitourinary system: Secondary | ICD-10-CM | POA: Diagnosis not present

## 2021-10-29 DIAGNOSIS — R3915 Urgency of urination: Secondary | ICD-10-CM | POA: Diagnosis not present

## 2021-10-30 DIAGNOSIS — M47896 Other spondylosis, lumbar region: Secondary | ICD-10-CM | POA: Diagnosis not present

## 2021-11-04 DIAGNOSIS — M47896 Other spondylosis, lumbar region: Secondary | ICD-10-CM | POA: Diagnosis not present

## 2021-11-05 DIAGNOSIS — E039 Hypothyroidism, unspecified: Secondary | ICD-10-CM | POA: Diagnosis not present

## 2021-11-05 DIAGNOSIS — Z0000 Encounter for general adult medical examination without abnormal findings: Secondary | ICD-10-CM | POA: Diagnosis not present

## 2021-11-05 DIAGNOSIS — I10 Essential (primary) hypertension: Secondary | ICD-10-CM | POA: Diagnosis not present

## 2021-11-05 DIAGNOSIS — K219 Gastro-esophageal reflux disease without esophagitis: Secondary | ICD-10-CM | POA: Diagnosis not present

## 2021-11-05 DIAGNOSIS — Z1331 Encounter for screening for depression: Secondary | ICD-10-CM | POA: Diagnosis not present

## 2021-11-05 DIAGNOSIS — E7800 Pure hypercholesterolemia, unspecified: Secondary | ICD-10-CM | POA: Diagnosis not present

## 2021-11-05 DIAGNOSIS — M8580 Other specified disorders of bone density and structure, unspecified site: Secondary | ICD-10-CM | POA: Diagnosis not present

## 2021-11-05 DIAGNOSIS — M79675 Pain in left toe(s): Secondary | ICD-10-CM | POA: Diagnosis not present

## 2021-11-05 DIAGNOSIS — L309 Dermatitis, unspecified: Secondary | ICD-10-CM | POA: Diagnosis not present

## 2021-11-06 DIAGNOSIS — M47896 Other spondylosis, lumbar region: Secondary | ICD-10-CM | POA: Diagnosis not present

## 2021-11-08 DIAGNOSIS — M47896 Other spondylosis, lumbar region: Secondary | ICD-10-CM | POA: Diagnosis not present

## 2021-11-15 DIAGNOSIS — M47896 Other spondylosis, lumbar region: Secondary | ICD-10-CM | POA: Diagnosis not present

## 2021-11-19 DIAGNOSIS — M47896 Other spondylosis, lumbar region: Secondary | ICD-10-CM | POA: Diagnosis not present

## 2021-11-21 DIAGNOSIS — M47896 Other spondylosis, lumbar region: Secondary | ICD-10-CM | POA: Diagnosis not present

## 2022-02-01 DIAGNOSIS — J0140 Acute pansinusitis, unspecified: Secondary | ICD-10-CM | POA: Diagnosis not present

## 2022-02-11 DIAGNOSIS — J019 Acute sinusitis, unspecified: Secondary | ICD-10-CM | POA: Diagnosis not present

## 2022-02-21 DIAGNOSIS — T7840XA Allergy, unspecified, initial encounter: Secondary | ICD-10-CM | POA: Diagnosis not present

## 2022-02-21 DIAGNOSIS — J019 Acute sinusitis, unspecified: Secondary | ICD-10-CM | POA: Diagnosis not present

## 2022-02-21 DIAGNOSIS — H1010 Acute atopic conjunctivitis, unspecified eye: Secondary | ICD-10-CM | POA: Diagnosis not present

## 2022-02-21 DIAGNOSIS — Z23 Encounter for immunization: Secondary | ICD-10-CM | POA: Diagnosis not present

## 2022-06-30 DIAGNOSIS — R21 Rash and other nonspecific skin eruption: Secondary | ICD-10-CM | POA: Diagnosis not present

## 2022-06-30 DIAGNOSIS — Z01419 Encounter for gynecological examination (general) (routine) without abnormal findings: Secondary | ICD-10-CM | POA: Diagnosis not present

## 2022-09-18 DIAGNOSIS — L298 Other pruritus: Secondary | ICD-10-CM | POA: Diagnosis not present

## 2022-09-18 DIAGNOSIS — L408 Other psoriasis: Secondary | ICD-10-CM | POA: Diagnosis not present

## 2022-09-18 DIAGNOSIS — L309 Dermatitis, unspecified: Secondary | ICD-10-CM | POA: Diagnosis not present

## 2022-09-18 DIAGNOSIS — L821 Other seborrheic keratosis: Secondary | ICD-10-CM | POA: Diagnosis not present

## 2022-09-24 DIAGNOSIS — H524 Presbyopia: Secondary | ICD-10-CM | POA: Diagnosis not present

## 2022-10-15 DIAGNOSIS — M545 Low back pain, unspecified: Secondary | ICD-10-CM | POA: Diagnosis not present

## 2022-10-15 DIAGNOSIS — M25571 Pain in right ankle and joints of right foot: Secondary | ICD-10-CM | POA: Diagnosis not present

## 2022-12-02 DIAGNOSIS — E7800 Pure hypercholesterolemia, unspecified: Secondary | ICD-10-CM | POA: Diagnosis not present

## 2022-12-02 DIAGNOSIS — Z23 Encounter for immunization: Secondary | ICD-10-CM | POA: Diagnosis not present

## 2022-12-02 DIAGNOSIS — I10 Essential (primary) hypertension: Secondary | ICD-10-CM | POA: Diagnosis not present

## 2022-12-02 DIAGNOSIS — E039 Hypothyroidism, unspecified: Secondary | ICD-10-CM | POA: Diagnosis not present

## 2022-12-02 DIAGNOSIS — Z0000 Encounter for general adult medical examination without abnormal findings: Secondary | ICD-10-CM | POA: Diagnosis not present

## 2022-12-02 DIAGNOSIS — L309 Dermatitis, unspecified: Secondary | ICD-10-CM | POA: Diagnosis not present

## 2022-12-02 DIAGNOSIS — N1831 Chronic kidney disease, stage 3a: Secondary | ICD-10-CM | POA: Diagnosis not present

## 2022-12-02 DIAGNOSIS — Z1159 Encounter for screening for other viral diseases: Secondary | ICD-10-CM | POA: Diagnosis not present

## 2022-12-02 DIAGNOSIS — M8580 Other specified disorders of bone density and structure, unspecified site: Secondary | ICD-10-CM | POA: Diagnosis not present

## 2022-12-16 DIAGNOSIS — L409 Psoriasis, unspecified: Secondary | ICD-10-CM | POA: Diagnosis not present

## 2022-12-16 DIAGNOSIS — L309 Dermatitis, unspecified: Secondary | ICD-10-CM | POA: Diagnosis not present

## 2022-12-16 DIAGNOSIS — L408 Other psoriasis: Secondary | ICD-10-CM | POA: Diagnosis not present

## 2023-01-23 DIAGNOSIS — D225 Melanocytic nevi of trunk: Secondary | ICD-10-CM | POA: Diagnosis not present

## 2023-01-23 DIAGNOSIS — R238 Other skin changes: Secondary | ICD-10-CM | POA: Diagnosis not present

## 2023-01-23 DIAGNOSIS — B078 Other viral warts: Secondary | ICD-10-CM | POA: Diagnosis not present

## 2023-01-23 DIAGNOSIS — L408 Other psoriasis: Secondary | ICD-10-CM | POA: Diagnosis not present

## 2023-01-23 DIAGNOSIS — L821 Other seborrheic keratosis: Secondary | ICD-10-CM | POA: Diagnosis not present

## 2023-01-23 DIAGNOSIS — L304 Erythema intertrigo: Secondary | ICD-10-CM | POA: Diagnosis not present

## 2023-01-23 DIAGNOSIS — L814 Other melanin hyperpigmentation: Secondary | ICD-10-CM | POA: Diagnosis not present

## 2023-02-24 DIAGNOSIS — Z23 Encounter for immunization: Secondary | ICD-10-CM | POA: Diagnosis not present

## 2023-05-26 DIAGNOSIS — H52223 Regular astigmatism, bilateral: Secondary | ICD-10-CM | POA: Diagnosis not present

## 2023-05-26 DIAGNOSIS — H04123 Dry eye syndrome of bilateral lacrimal glands: Secondary | ICD-10-CM | POA: Diagnosis not present

## 2023-05-26 DIAGNOSIS — H2513 Age-related nuclear cataract, bilateral: Secondary | ICD-10-CM | POA: Diagnosis not present

## 2023-05-26 DIAGNOSIS — H5213 Myopia, bilateral: Secondary | ICD-10-CM | POA: Diagnosis not present

## 2023-06-22 DIAGNOSIS — H2512 Age-related nuclear cataract, left eye: Secondary | ICD-10-CM | POA: Diagnosis not present

## 2023-06-22 DIAGNOSIS — H25811 Combined forms of age-related cataract, right eye: Secondary | ICD-10-CM | POA: Diagnosis not present

## 2023-07-08 DIAGNOSIS — H25811 Combined forms of age-related cataract, right eye: Secondary | ICD-10-CM | POA: Diagnosis not present

## 2023-07-08 DIAGNOSIS — I10 Essential (primary) hypertension: Secondary | ICD-10-CM | POA: Diagnosis not present

## 2023-07-08 DIAGNOSIS — E039 Hypothyroidism, unspecified: Secondary | ICD-10-CM | POA: Diagnosis not present

## 2023-07-22 DIAGNOSIS — I10 Essential (primary) hypertension: Secondary | ICD-10-CM | POA: Diagnosis not present

## 2023-07-22 DIAGNOSIS — H5213 Myopia, bilateral: Secondary | ICD-10-CM | POA: Diagnosis not present

## 2023-07-22 DIAGNOSIS — H52223 Regular astigmatism, bilateral: Secondary | ICD-10-CM | POA: Diagnosis not present

## 2023-07-22 DIAGNOSIS — H04123 Dry eye syndrome of bilateral lacrimal glands: Secondary | ICD-10-CM | POA: Diagnosis not present

## 2023-07-22 DIAGNOSIS — H2513 Age-related nuclear cataract, bilateral: Secondary | ICD-10-CM | POA: Diagnosis not present

## 2023-07-22 DIAGNOSIS — H2512 Age-related nuclear cataract, left eye: Secondary | ICD-10-CM | POA: Diagnosis not present

## 2023-07-30 DIAGNOSIS — H2513 Age-related nuclear cataract, bilateral: Secondary | ICD-10-CM | POA: Diagnosis not present

## 2023-07-30 DIAGNOSIS — H2512 Age-related nuclear cataract, left eye: Secondary | ICD-10-CM | POA: Diagnosis not present

## 2023-07-30 DIAGNOSIS — H52223 Regular astigmatism, bilateral: Secondary | ICD-10-CM | POA: Diagnosis not present

## 2023-07-30 DIAGNOSIS — H5213 Myopia, bilateral: Secondary | ICD-10-CM | POA: Diagnosis not present

## 2023-07-30 DIAGNOSIS — H04123 Dry eye syndrome of bilateral lacrimal glands: Secondary | ICD-10-CM | POA: Diagnosis not present

## 2023-08-11 DIAGNOSIS — R051 Acute cough: Secondary | ICD-10-CM | POA: Diagnosis not present

## 2023-08-11 DIAGNOSIS — J302 Other seasonal allergic rhinitis: Secondary | ICD-10-CM | POA: Diagnosis not present

## 2023-08-11 DIAGNOSIS — R062 Wheezing: Secondary | ICD-10-CM | POA: Diagnosis not present

## 2023-08-21 DIAGNOSIS — Z0100 Encounter for examination of eyes and vision without abnormal findings: Secondary | ICD-10-CM | POA: Diagnosis not present

## 2023-10-26 DIAGNOSIS — Z1231 Encounter for screening mammogram for malignant neoplasm of breast: Secondary | ICD-10-CM | POA: Diagnosis not present

## 2023-10-29 DIAGNOSIS — M65341 Trigger finger, right ring finger: Secondary | ICD-10-CM | POA: Diagnosis not present

## 2023-11-30 DIAGNOSIS — M79672 Pain in left foot: Secondary | ICD-10-CM | POA: Diagnosis not present

## 2023-11-30 DIAGNOSIS — M65341 Trigger finger, right ring finger: Secondary | ICD-10-CM | POA: Diagnosis not present

## 2023-12-02 DIAGNOSIS — L400 Psoriasis vulgaris: Secondary | ICD-10-CM | POA: Diagnosis not present

## 2023-12-02 DIAGNOSIS — L821 Other seborrheic keratosis: Secondary | ICD-10-CM | POA: Diagnosis not present

## 2023-12-22 DIAGNOSIS — H16223 Keratoconjunctivitis sicca, not specified as Sjogren's, bilateral: Secondary | ICD-10-CM | POA: Diagnosis not present

## 2023-12-22 DIAGNOSIS — H04123 Dry eye syndrome of bilateral lacrimal glands: Secondary | ICD-10-CM | POA: Diagnosis not present

## 2023-12-24 DIAGNOSIS — E7800 Pure hypercholesterolemia, unspecified: Secondary | ICD-10-CM | POA: Diagnosis not present

## 2023-12-24 DIAGNOSIS — E559 Vitamin D deficiency, unspecified: Secondary | ICD-10-CM | POA: Diagnosis not present

## 2023-12-24 DIAGNOSIS — Z0000 Encounter for general adult medical examination without abnormal findings: Secondary | ICD-10-CM | POA: Diagnosis not present

## 2023-12-24 DIAGNOSIS — I10 Essential (primary) hypertension: Secondary | ICD-10-CM | POA: Diagnosis not present

## 2023-12-24 DIAGNOSIS — M8580 Other specified disorders of bone density and structure, unspecified site: Secondary | ICD-10-CM | POA: Diagnosis not present

## 2023-12-24 DIAGNOSIS — K219 Gastro-esophageal reflux disease without esophagitis: Secondary | ICD-10-CM | POA: Diagnosis not present

## 2023-12-24 DIAGNOSIS — Z1211 Encounter for screening for malignant neoplasm of colon: Secondary | ICD-10-CM | POA: Diagnosis not present

## 2023-12-24 DIAGNOSIS — G629 Polyneuropathy, unspecified: Secondary | ICD-10-CM | POA: Diagnosis not present

## 2023-12-24 DIAGNOSIS — E039 Hypothyroidism, unspecified: Secondary | ICD-10-CM | POA: Diagnosis not present

## 2023-12-24 DIAGNOSIS — Z1331 Encounter for screening for depression: Secondary | ICD-10-CM | POA: Diagnosis not present

## 2023-12-30 DIAGNOSIS — Z1211 Encounter for screening for malignant neoplasm of colon: Secondary | ICD-10-CM | POA: Diagnosis not present

## 2024-01-27 DIAGNOSIS — L400 Psoriasis vulgaris: Secondary | ICD-10-CM | POA: Diagnosis not present

## 2024-01-27 DIAGNOSIS — E538 Deficiency of other specified B group vitamins: Secondary | ICD-10-CM | POA: Diagnosis not present

## 2024-01-27 DIAGNOSIS — L814 Other melanin hyperpigmentation: Secondary | ICD-10-CM | POA: Diagnosis not present

## 2024-01-27 DIAGNOSIS — L408 Other psoriasis: Secondary | ICD-10-CM | POA: Diagnosis not present

## 2024-01-27 DIAGNOSIS — B078 Other viral warts: Secondary | ICD-10-CM | POA: Diagnosis not present

## 2024-01-27 DIAGNOSIS — L304 Erythema intertrigo: Secondary | ICD-10-CM | POA: Diagnosis not present

## 2024-01-27 DIAGNOSIS — L821 Other seborrheic keratosis: Secondary | ICD-10-CM | POA: Diagnosis not present

## 2024-02-24 DIAGNOSIS — H16223 Keratoconjunctivitis sicca, not specified as Sjogren's, bilateral: Secondary | ICD-10-CM | POA: Diagnosis not present

## 2024-02-24 DIAGNOSIS — H04123 Dry eye syndrome of bilateral lacrimal glands: Secondary | ICD-10-CM | POA: Diagnosis not present

## 2024-03-02 DIAGNOSIS — Z23 Encounter for immunization: Secondary | ICD-10-CM | POA: Diagnosis not present

## 2024-03-25 DIAGNOSIS — E039 Hypothyroidism, unspecified: Secondary | ICD-10-CM | POA: Diagnosis not present
